# Patient Record
Sex: Female | Born: 1955
Health system: Southern US, Community
[De-identification: ages and names within clinical notes are randomized; demographics above are authoritative.]

## PROBLEM LIST (undated history)

## (undated) DIAGNOSIS — I10 Essential (primary) hypertension: Secondary | ICD-10-CM

## (undated) HISTORY — PX: TUBAL LIGATION: SHX77

---

## 2001-03-08 ENCOUNTER — Other Ambulatory Visit: Admission: RE | Admit: 2001-03-08 | Discharge: 2001-03-08 | Payer: Self-pay | Admitting: Family Medicine

## 2008-10-04 ENCOUNTER — Ambulatory Visit: Payer: Self-pay | Admitting: Nurse Practitioner

## 2008-10-04 DIAGNOSIS — F17200 Nicotine dependence, unspecified, uncomplicated: Secondary | ICD-10-CM

## 2008-10-04 DIAGNOSIS — Z720 Tobacco use: Secondary | ICD-10-CM | POA: Insufficient documentation

## 2008-10-04 DIAGNOSIS — I10 Essential (primary) hypertension: Secondary | ICD-10-CM

## 2008-10-04 DIAGNOSIS — K089 Disorder of teeth and supporting structures, unspecified: Secondary | ICD-10-CM | POA: Insufficient documentation

## 2008-11-05 ENCOUNTER — Encounter (INDEPENDENT_AMBULATORY_CARE_PROVIDER_SITE_OTHER): Payer: Self-pay | Admitting: Nurse Practitioner

## 2008-11-05 ENCOUNTER — Ambulatory Visit: Payer: Self-pay | Admitting: Nurse Practitioner

## 2008-11-05 DIAGNOSIS — A599 Trichomoniasis, unspecified: Secondary | ICD-10-CM

## 2008-11-05 DIAGNOSIS — K5900 Constipation, unspecified: Secondary | ICD-10-CM | POA: Insufficient documentation

## 2008-11-05 LAB — CONVERTED CEMR LAB
Chlamydia, DNA Probe: NEGATIVE
Eosinophils Absolute: 0.1 10*3/uL (ref 0.0–0.7)
Eosinophils Relative: 2 % (ref 0–5)
GC Probe Amp, Genital: NEGATIVE
Hemoglobin: 10.4 g/dL — ABNORMAL LOW (ref 12.0–15.0)
KOH Prep: NEGATIVE
Ketones, urine, test strip: NEGATIVE
Lymphocytes Relative: 32 % (ref 12–46)
MCHC: 28.9 g/dL — ABNORMAL LOW (ref 30.0–36.0)
MCV: 83.9 fL (ref 78.0–100.0)
Monocytes Absolute: 0.4 10*3/uL (ref 0.1–1.0)
Monocytes Relative: 6 % (ref 3–12)
Neutro Abs: 3.4 10*3/uL (ref 1.7–7.7)
Platelets: 328 10*3/uL (ref 150–400)
Potassium: 4.6 meq/L (ref 3.5–5.3)
Sodium: 140 meq/L (ref 135–145)
TSH: 0.786 microintl units/mL (ref 0.350–4.50)
Total Bilirubin: 0.3 mg/dL (ref 0.3–1.2)
Total CHOL/HDL Ratio: 4.1
Triglycerides: 79 mg/dL (ref <150)
pH: 7.5

## 2008-11-08 ENCOUNTER — Encounter (INDEPENDENT_AMBULATORY_CARE_PROVIDER_SITE_OTHER): Payer: Self-pay | Admitting: Nurse Practitioner

## 2008-11-20 ENCOUNTER — Ambulatory Visit (HOSPITAL_COMMUNITY): Admission: RE | Admit: 2008-11-20 | Discharge: 2008-11-20 | Payer: Self-pay | Admitting: Family Medicine

## 2008-11-26 ENCOUNTER — Ambulatory Visit: Payer: Self-pay | Admitting: Nurse Practitioner

## 2009-02-18 ENCOUNTER — Ambulatory Visit: Payer: Self-pay | Admitting: Nurse Practitioner

## 2009-03-18 ENCOUNTER — Ambulatory Visit: Payer: Self-pay | Admitting: Nurse Practitioner

## 2009-04-15 ENCOUNTER — Ambulatory Visit: Payer: Self-pay | Admitting: Nurse Practitioner

## 2009-05-15 ENCOUNTER — Ambulatory Visit: Payer: Self-pay | Admitting: Nurse Practitioner

## 2009-05-15 DIAGNOSIS — D239 Other benign neoplasm of skin, unspecified: Secondary | ICD-10-CM | POA: Insufficient documentation

## 2009-06-05 ENCOUNTER — Ambulatory Visit: Payer: Self-pay | Admitting: Nurse Practitioner

## 2009-07-01 ENCOUNTER — Ambulatory Visit: Payer: Self-pay | Admitting: Nurse Practitioner

## 2009-08-11 ENCOUNTER — Encounter (INDEPENDENT_AMBULATORY_CARE_PROVIDER_SITE_OTHER): Payer: Self-pay | Admitting: Family Medicine

## 2009-08-29 ENCOUNTER — Ambulatory Visit: Payer: Self-pay | Admitting: Nurse Practitioner

## 2009-08-29 DIAGNOSIS — M25569 Pain in unspecified knee: Secondary | ICD-10-CM

## 2013-10-09 ENCOUNTER — Emergency Department (INDEPENDENT_AMBULATORY_CARE_PROVIDER_SITE_OTHER)
Admission: EM | Admit: 2013-10-09 | Discharge: 2013-10-09 | Disposition: A | Discharge status: Home / Self Care | Payer: Self-pay | Source: Home / Self Care | Attending: Family Medicine | Admitting: Family Medicine

## 2013-10-09 ENCOUNTER — Encounter (HOSPITAL_COMMUNITY): Payer: Self-pay | Admitting: Emergency Medicine

## 2013-10-09 DIAGNOSIS — I10 Essential (primary) hypertension: Secondary | ICD-10-CM

## 2013-10-09 HISTORY — DX: Essential (primary) hypertension: I10

## 2013-10-09 LAB — POCT I-STAT, CHEM 8
BUN: 18 mg/dL (ref 6–23)
Calcium, Ion: 1.2 mmol/L (ref 1.12–1.23)
Creatinine, Ser: 1 mg/dL (ref 0.50–1.10)
Potassium: 3.7 mEq/L (ref 3.5–5.1)
Sodium: 143 mEq/L (ref 135–145)
TCO2: 28 mmol/L (ref 0–100)

## 2013-10-09 MED ORDER — CLONIDINE HCL 0.1 MG PO TABS
0.1000 mg | ORAL_TABLET | Freq: Once | ORAL | Status: AC
  Administered 2013-10-09: 0.1 mg via ORAL

## 2013-10-09 MED ORDER — FUROSEMIDE 40 MG PO TABS
ORAL_TABLET | ORAL | Status: AC
  Filled 2013-10-09: qty 1

## 2013-10-09 MED ORDER — CLONIDINE HCL 0.1 MG PO TABS
ORAL_TABLET | ORAL | Status: AC
  Filled 2013-10-09: qty 1

## 2013-10-09 MED ORDER — LISINOPRIL 10 MG PO TABS: 10.0000 mg | ORAL_TABLET | Freq: Every day | ORAL | Status: DC

## 2013-10-09 MED ORDER — AMLODIPINE BESYLATE 5 MG PO TABS: 5.0000 mg | ORAL_TABLET | Freq: Every day | ORAL | Status: DC

## 2013-10-09 MED ORDER — HYDROCHLOROTHIAZIDE 12.5 MG PO TABS: 12.5000 mg | ORAL_TABLET | Freq: Every day | ORAL | Status: DC

## 2013-10-09 MED ORDER — FUROSEMIDE 40 MG PO TABS
40.0000 mg | ORAL_TABLET | Freq: Once | ORAL | Status: AC
  Administered 2013-10-09: 40 mg via ORAL

## 2013-10-09 NOTE — ED Provider Notes (Signed)
CSN: [id number]     Arrival date & time 10/09/13  1348 History   First MD Initiated Contact with Patient 10/09/13 1447     Chief Complaint  Patient presents with  . Hypertension   (Consider location/radiation/quality/duration/timing/severity/associated sxs/prior Treatment) Patient is a 57 y.o. female presenting with hypertension. The history is provided by the patient.  Hypertension This is a chronic problem. Episode onset: out of bp med for approx 1 yr since [facility] closed and didn't know where to go. Pertinent negatives include no chest pain, no headaches and no shortness of breath.    Past Medical History  Diagnosis Date  . Hypertension    History reviewed. No pertinent past surgical history. No family history on file. History  Substance Use Topics  . Smoking status: Never Smoker   . Smokeless tobacco: Not on file  . Alcohol Use: No   OB History   Grav Para Term Preterm Abortions TAB SAB Ect Mult Living                 Review of Systems  Constitutional: Negative.   Respiratory: Negative for chest tightness, shortness of breath and wheezing.   Cardiovascular: Positive for leg swelling. Negative for chest pain.  Neurological: Negative for dizziness, speech difficulty, weakness and headaches.    Allergies  Review of patient's allergies indicates no known allergies.  Home Medications   Current Outpatient Rx  Name  Route  Sig  Dispense  Refill  . amLODipine (NORVASC) 5 MG tablet   Oral   Take 1 tablet (5 mg total) by mouth daily.   30 tablet   0   . hydrochlorothiazide (HYDRODIURIL) 12.5 MG tablet   Oral   Take 1 tablet (12.5 mg total) by mouth daily.   30 tablet   1   . lisinopril (PRINIVIL,ZESTRIL) 10 MG tablet   Oral   Take 1 tablet (10 mg total) by mouth daily.   30 tablet   0    BP 199/106  Pulse 65  Temp(Src) 99.1 F (37.3 C) (Oral)  Resp 15  SpO2 98%  LMP 08/09/2013 Physical Exam  Nursing note and vitals reviewed. Constitutional: She  is oriented to person, place, and time. She appears well-developed and well-nourished.  Neck: Normal range of motion. Neck supple.  Cardiovascular: Normal rate, regular rhythm, normal heart sounds and intact distal pulses.   Pulmonary/Chest: Effort normal and breath sounds normal.  Musculoskeletal: She exhibits no edema.  Lymphadenopathy:    She has no cervical adenopathy.  Neurological: She is alert and oriented to person, place, and time.  Skin: Skin is warm and dry.    ED Course  Procedures (including critical care time) Labs Review Labs Reviewed - No data to display Imaging Review No results found.  EKG Interpretation     Ventricular Rate:    PR Interval:    QRS Duration:   QT Interval:    QTC Calculation:   R Axis:     Text Interpretation:              MDM  i-stat wnl.    Linna Hoff, MD 10/09/13 [phone]

## 2013-10-09 NOTE — ED Notes (Signed)
Reports htn history.  Patient participated in bp check at [facility] today, bp noted to be high.  Patient reports was patient of [facility] until [facility], ran out of bp med and now needing to secure a pcp.

## 2015-08-14 ENCOUNTER — Emergency Department (HOSPITAL_COMMUNITY)
Admission: EM | Admit: 2015-08-14 | Discharge: 2015-08-15 | Disposition: A | Discharge status: Home / Self Care | Payer: No Typology Code available for payment source | Attending: Emergency Medicine | Admitting: Emergency Medicine

## 2015-08-14 ENCOUNTER — Encounter (HOSPITAL_COMMUNITY): Payer: Self-pay | Admitting: *Deleted

## 2015-08-14 DIAGNOSIS — S199XXA Unspecified injury of neck, initial encounter: Secondary | ICD-10-CM | POA: Diagnosis present

## 2015-08-14 DIAGNOSIS — Y9389 Activity, other specified: Secondary | ICD-10-CM | POA: Diagnosis not present

## 2015-08-14 DIAGNOSIS — Y92410 Unspecified street and highway as the place of occurrence of the external cause: Secondary | ICD-10-CM | POA: Diagnosis not present

## 2015-08-14 DIAGNOSIS — Z79899 Other long term (current) drug therapy: Secondary | ICD-10-CM | POA: Insufficient documentation

## 2015-08-14 DIAGNOSIS — I10 Essential (primary) hypertension: Secondary | ICD-10-CM

## 2015-08-14 DIAGNOSIS — Y999 Unspecified external cause status: Secondary | ICD-10-CM | POA: Diagnosis not present

## 2015-08-14 DIAGNOSIS — S134XXA Sprain of ligaments of cervical spine, initial encounter: Secondary | ICD-10-CM | POA: Diagnosis not present

## 2015-08-14 NOTE — ED Notes (Signed)
Pt has not taken her blood pressure medication today.
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
## 2015-08-15 ENCOUNTER — Emergency Department (HOSPITAL_COMMUNITY): Payer: No Typology Code available for payment source

## 2015-08-15 DIAGNOSIS — S134XXA Sprain of ligaments of cervical spine, initial encounter: Secondary | ICD-10-CM | POA: Diagnosis not present

## 2015-08-15 MED ORDER — HYDROCHLOROTHIAZIDE 25 MG PO TABS
12.5000 mg | ORAL_TABLET | Freq: Every day | ORAL | Status: DC
  Administered 2015-08-15: 12.5 mg via ORAL
  Filled 2015-08-15: qty 1

## 2015-08-15 MED ORDER — CYCLOBENZAPRINE HCL 10 MG PO TABS: 5.0000 mg | ORAL_TABLET | Freq: Two times a day (BID) | ORAL | Status: DC | PRN

## 2015-08-15 MED ORDER — MELOXICAM 15 MG PO TABS: 15.0000 mg | ORAL_TABLET | Freq: Every day | ORAL | Status: DC

## 2015-08-15 MED ORDER — IBUPROFEN 400 MG PO TABS
800.0000 mg | ORAL_TABLET | Freq: Once | ORAL | Status: AC
  Administered 2015-08-15: 800 mg via ORAL
  Filled 2015-08-15: qty 2

## 2015-08-15 MED ORDER — CYCLOBENZAPRINE HCL 10 MG PO TABS
5.0000 mg | ORAL_TABLET | Freq: Once | ORAL | Status: AC
  Administered 2015-08-15: 5 mg via ORAL
  Filled 2015-08-15: qty 1

## 2015-08-15 MED ORDER — LISINOPRIL 10 MG PO TABS
10.0000 mg | ORAL_TABLET | Freq: Every day | ORAL | Status: DC
  Administered 2015-08-15: 10 mg via ORAL
  Filled 2015-08-15: qty 1

## 2015-08-15 MED ORDER — AMLODIPINE BESYLATE 5 MG PO TABS
5.0000 mg | ORAL_TABLET | Freq: Every day | ORAL | Status: DC
  Administered 2015-08-15: 5 mg via ORAL
  Filled 2015-08-15: qty 1

## 2015-08-15 NOTE — Discharge Instructions (Signed)
Cervical Sprain °A cervical sprain is an injury in the neck in which the strong, fibrous tissues (ligaments) that connect your neck bones stretch or tear. Cervical sprains can range from mild to severe. Severe cervical sprains can cause the neck vertebrae to be unstable. This can lead to damage of the spinal cord and can result in serious nervous system problems. The amount of time it takes for a cervical sprain to get better depends on the cause and extent of the injury. Most cervical sprains heal in 1 to 3 weeks. °CAUSES  °Severe cervical sprains may be caused by:  °· Contact sport injuries (such as from football, rugby, wrestling, hockey, auto racing, gymnastics, diving, martial arts, or boxing).   °· Motor vehicle collisions.   °· Whiplash injuries. This is an injury from a sudden forward and backward whipping movement of the head and neck.  °· Falls.   °Mild cervical sprains may be caused by:  °· Being in an awkward position, such as while cradling a telephone between your ear and shoulder.   °· Sitting in a chair that does not offer proper support.   °· Working at a poorly designed computer station.   °· Looking up or down for long periods of time.   °SYMPTOMS  °· Pain, soreness, stiffness, or a burning sensation in the front, back, or sides of the neck. This discomfort may develop immediately after the injury or slowly, 24 hours or more after the injury.   °· Pain or tenderness directly in the middle of the back of the neck.   °· Shoulder or upper back pain.   °· Limited ability to move the neck.   °· Headache.   °· Dizziness.   °· Weakness, numbness, or tingling in the hands or arms.   °· Muscle spasms.   °· Difficulty swallowing or chewing.   °· Tenderness and swelling of the neck.   °DIAGNOSIS  °Most of the time your health care provider can diagnose a cervical sprain by taking your history and doing a physical exam. Your health care provider will ask about previous neck injuries and any known neck  problems, such as arthritis in the neck. X-rays may be taken to find out if there are any other problems, such as with the bones of the neck. Other tests, such as a CT scan or MRI, may also be needed.  °TREATMENT  °Treatment depends on the severity of the cervical sprain. Mild sprains can be treated with rest, keeping the neck in place (immobilization), and pain medicines. Severe cervical sprains are immediately immobilized. Further treatment is done to help with pain, muscle spasms, and other symptoms and may include: °· Medicines, such as pain relievers, numbing medicines, or muscle relaxants.   °· Physical therapy. This may involve stretching exercises, strengthening exercises, and posture training. Exercises and improved posture can help stabilize the neck, strengthen muscles, and help stop symptoms from returning.   °HOME CARE INSTRUCTIONS  °· Put ice on the injured area.   °¨ Put ice in a plastic bag.   °¨ Place a towel between your skin and the bag.   °¨ Leave the ice on for 15-20 minutes, 3-4 times a day.   °· If your injury was severe, you may have been given a cervical collar to wear. A cervical collar is a two-piece collar designed to keep your neck from moving while it heals. °¨ Do not remove the collar unless instructed by your health care provider. °¨ If you have long hair, keep it outside of the collar. °¨ Ask your health care provider before making any adjustments to your collar. Minor   adjustments may be required over time to improve comfort and reduce pressure on your chin or on the back of your head. °¨ If you are allowed to remove the collar for cleaning or bathing, follow your health care provider's instructions on how to do so safely. °¨ Keep your collar clean by wiping it with mild soap and water and drying it completely. If the collar you have been given includes removable pads, remove them every 1-2 days and hand wash them with soap and water. Allow them to air dry. They should be completely  dry before you wear them in the collar. °¨ If you are allowed to remove the collar for cleaning and bathing, wash and dry the skin of your neck. Check your skin for irritation or sores. If you see any, tell your health care provider. °¨ Do not drive while wearing the collar.   °· Only take over-the-counter or prescription medicines for pain, discomfort, or fever as directed by your health care provider.   °· Keep all follow-up appointments as directed by your health care provider.   °· Keep all physical therapy appointments as directed by your health care provider.   °· Make any needed adjustments to your workstation to promote good posture.   °· Avoid positions and activities that make your symptoms worse.   °· Warm up and stretch before being active to help prevent problems.   °SEEK MEDICAL CARE IF:  °· Your pain is not controlled with medicine.   °· You are unable to decrease your pain medicine over time as planned.   °· Your activity level is not improving as expected.   °SEEK IMMEDIATE MEDICAL CARE IF:  °· You develop any bleeding. °· You develop stomach upset. °· You have signs of an allergic reaction to your medicine.   °· Your symptoms get worse.   °· You develop new, unexplained symptoms.   °· You have numbness, tingling, weakness, or paralysis in any part of your body.   °MAKE SURE YOU:  °· Understand these instructions. °· Will watch your condition. °· Will get help right away if you are not doing well or get worse. °Document Released: 10/10/2007 Document Revised: 12/18/2013 Document Reviewed: 06/20/2013 °[employer]® Patient Information ©2015 [employer]. This information is not intended to replace advice given to you by your health care provider. Make sure you discuss any questions you have with your health care provider. ° °Motor Vehicle Collision °It is common to have multiple bruises and sore muscles after a motor vehicle collision (MVC). These tend to feel worse for the first 24 hours. You may have  the most stiffness and soreness over the first several hours. You may also feel worse when you wake up the first morning after your collision. After this point, you will usually begin to improve with each day. The speed of improvement often depends on the severity of the collision, the number of injuries, and the location and nature of these injuries. °HOME CARE INSTRUCTIONS °· Put ice on the injured area. °¨ Put ice in a plastic bag. °¨ Place a towel between your skin and the bag. °¨ Leave the ice on for 15-20 minutes, 3-4 times a day, or as directed by your health care provider. °· Drink enough fluids to keep your urine clear or pale yellow. Do not drink alcohol. °· Take a warm shower or bath once or twice a day. This will increase blood flow to sore muscles. °· You may return to activities as directed by your caregiver. Be careful when lifting, as this may aggravate neck or back   pain.  Only take over-the-counter or prescription medicines for pain, discomfort, or fever as directed by your caregiver. Do not use aspirin. This may increase bruising and bleeding. SEEK IMMEDIATE MEDICAL CARE IF:  You have numbness, tingling, or weakness in the arms or legs.  You develop severe headaches not relieved with medicine.  You have severe neck pain, especially tenderness in the middle of the back of your neck.  You have changes in bowel or bladder control.  There is increasing pain in any area of the body.  You have shortness of breath, light-headedness, dizziness, or fainting.  You have chest pain.  You feel sick to your stomach (nauseous), throw up (vomit), or sweat.  You have increasing abdominal discomfort.  There is blood in your urine, stool, or vomit.  You have pain in your shoulder (shoulder strap areas).  You feel your symptoms are getting worse. MAKE SURE YOU:  Understand these instructions.  Will watch your condition.  Will get help right away if you are not doing well or get  worse. Document Released: 12/13/2005 Document Revised: 04/29/2014 Document Reviewed: 05/12/2011 [facility] Patient Information 2015 [place], [place]. This information is not intended to replace advice given to you by your health care provider. Make sure you discuss any questions you have with your health care provider.  Motor Vehicle Collision After a car crash (motor vehicle collision), it is normal to have bruises and sore muscles. The first 24 hours usually feel the worst. After that, you will likely start to feel better each day. HOME CARE  Put ice on the injured area.  Put ice in a plastic bag.  Place a towel between your skin and the bag.  Leave the ice on for 15-20 minutes, 03-04 times a day.  Drink enough fluids to keep your pee (urine) clear or pale yellow.  Do not drink alcohol.  Take a warm shower or bath 1 or 2 times a day. This helps your sore muscles.  Return to activities as told by your doctor. Be careful when lifting. Lifting can make neck or back pain worse.  Only take medicine as told by your doctor. Do not use aspirin. GET HELP RIGHT AWAY IF:   Your arms or legs tingle, feel weak, or lose feeling (numbness).  You have headaches that do not get better with medicine.  You have neck pain, especially in the middle of the back of your neck.  You cannot control when you pee (urinate) or poop (bowel movement).  Pain is getting worse in any part of your body.  You are short of breath, dizzy, or pass out (faint).  You have chest pain.  You feel sick to your stomach (nauseous), throw up (vomit), or sweat.  You have belly (abdominal) pain that gets worse.  There is blood in your pee, poop, or throw up.  You have pain in your shoulder (shoulder strap areas).  Your problems are getting worse. MAKE SURE YOU:   Understand these instructions.  Will watch your condition.  Will get help right away if you are not doing well or get worse. Document Released:  05/31/2008 Document Revised: 03/06/2012 Document Reviewed: 05/12/2011 [facility] Patient Information 2015 [place], [place]. This information is not intended to replace advice given to you by your health care provider. Make sure you discuss any questions you have with your health care provider.  Cervical Sprain A cervical sprain is when the tissues (ligaments) that hold the neck bones in place stretch or tear. HOME CARE  Put ice on the injured area.  Put ice in a plastic bag.  Place a towel between your skin and the bag.  Leave the ice on for 15-20 minutes, 3-4 times a day.  You may have been given a collar to wear. This collar keeps your neck from moving while you heal.  Do not take the collar off unless told by your doctor.  If you have long hair, keep it outside of the collar.  Ask your doctor before changing the position of your collar. You may need to change its position over time to make it more comfortable.  If you are allowed to take off the collar for cleaning or bathing, follow your doctor's instructions on how to do it safely.  Keep your collar clean by wiping it with mild soap and water. Dry it completely. If the collar has removable pads, remove them every 1-2 days to hand wash them with soap and water. Allow them to air dry. They should be dry before you wear them in the collar.  Do not drive while wearing the collar.  Only take medicine as told by your doctor.  Keep all doctor visits as told.  Keep all physical therapy visits as told.  Adjust your work station so that you have good posture while you work.  Avoid positions and activities that make your problems worse.  Warm up and stretch before being active. GET HELP IF:  Your pain is not controlled with medicine.  You cannot take less pain medicine over time as planned.  Your activity level does not improve as expected. GET HELP RIGHT AWAY IF:   You are bleeding.  Your stomach is upset.  You have an  allergic reaction to your medicine.  You develop new problems that you cannot explain.  You lose feeling (become numb) or you cannot move any part of your body (paralysis).  You have tingling or weakness in any part of your body.  Your symptoms get worse. Symptoms include:  Pain, soreness, stiffness, puffiness (swelling), or a burning feeling in your neck.  Pain when your neck is touched.  Shoulder or upper back pain.  Limited ability to move your neck.  Headache.  Dizziness.  Your hands or arms feel week, lose feeling, or tingle.  Muscle spasms.  Difficulty swallowing or chewing. MAKE SURE YOU:   Understand these instructions.  Will watch your condition.  Will get help right away if you are not doing well or get worse. Document Released: 05/31/2008 Document Revised: 08/15/2013 Document Reviewed: 06/20/2013 [facility] Patient Information 2015 [place], [place]. This information is not intended to replace advice given to you by your health care provider. Make sure you discuss any questions you have with your health care provider.
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
## 2020-08-20 ENCOUNTER — Encounter (HOSPITAL_COMMUNITY): Payer: Self-pay | Admitting: Emergency Medicine

## 2020-08-20 ENCOUNTER — Emergency Department (HOSPITAL_COMMUNITY): Payer: Medicare HMO

## 2020-08-20 ENCOUNTER — Emergency Department (HOSPITAL_COMMUNITY)
Admission: EM | Admit: 2020-08-20 | Discharge: 2020-08-20 | Disposition: A | Discharge status: Home / Self Care | Payer: Medicare HMO | Attending: Emergency Medicine | Admitting: Emergency Medicine

## 2020-08-20 DIAGNOSIS — I10 Essential (primary) hypertension: Secondary | ICD-10-CM

## 2020-08-20 DIAGNOSIS — M79603 Pain in arm, unspecified: Secondary | ICD-10-CM | POA: Diagnosis not present

## 2020-08-20 DIAGNOSIS — M545 Low back pain: Secondary | ICD-10-CM | POA: Diagnosis not present

## 2020-08-20 DIAGNOSIS — Z79899 Other long term (current) drug therapy: Secondary | ICD-10-CM | POA: Insufficient documentation

## 2020-08-20 DIAGNOSIS — R52 Pain, unspecified: Secondary | ICD-10-CM | POA: Diagnosis not present

## 2020-08-20 MED ORDER — CYCLOBENZAPRINE HCL 10 MG PO TABS: 10.0000 mg | ORAL_TABLET | Freq: Three times a day (TID) | ORAL | 0 refills | Status: AC

## 2020-08-20 MED ORDER — AMLODIPINE BESYLATE 5 MG PO TABS
5.0000 mg | ORAL_TABLET | Freq: Once | ORAL | Status: AC
  Administered 2020-08-20: 5 mg via ORAL
  Filled 2020-08-20: qty 1

## 2020-08-20 MED ORDER — LISINOPRIL 10 MG PO TABS: 10.0000 mg | ORAL_TABLET | Freq: Every day | ORAL | 0 refills | Status: DC

## 2020-08-20 MED ORDER — CYCLOBENZAPRINE HCL 10 MG PO TABS
5.0000 mg | ORAL_TABLET | Freq: Once | ORAL | Status: AC
  Administered 2020-08-20: 5 mg via ORAL
  Filled 2020-08-20: qty 1

## 2020-08-20 MED ORDER — HYDROCHLOROTHIAZIDE 12.5 MG PO CAPS
12.5000 mg | ORAL_CAPSULE | Freq: Every day | ORAL | Status: DC
  Administered 2020-08-20: 12.5 mg via ORAL
  Filled 2020-08-20: qty 1

## 2020-08-20 MED ORDER — LISINOPRIL 10 MG PO TABS
10.0000 mg | ORAL_TABLET | Freq: Once | ORAL | Status: AC
  Administered 2020-08-20: 10 mg via ORAL
  Filled 2020-08-20: qty 1

## 2020-08-20 MED ORDER — LIDOCAINE 5 % EX PTCH
1.0000 | MEDICATED_PATCH | CUTANEOUS | Status: DC
  Administered 2020-08-20: 1 via TRANSDERMAL
  Filled 2020-08-20: qty 1

## 2020-08-20 MED ORDER — HYDROCHLOROTHIAZIDE 12.5 MG PO TABS: 12.5000 mg | ORAL_TABLET | Freq: Every day | ORAL | 1 refills | Status: DC

## 2020-08-20 MED ORDER — ACETAMINOPHEN 500 MG PO TABS
1000.0000 mg | ORAL_TABLET | Freq: Once | ORAL | Status: AC
  Administered 2020-08-20: 1000 mg via ORAL
  Filled 2020-08-20: qty 2

## 2020-08-20 MED ORDER — AMLODIPINE BESYLATE 5 MG PO TABS: 5.0000 mg | ORAL_TABLET | Freq: Every day | ORAL | 0 refills | Status: DC
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
## 2020-08-20 NOTE — Discharge Instructions (Addendum)
Take blood pressure medicines  Schedule appointment with primary care doctor  Tylenol 1000 mg every 6 hours for back pain, flexeril for spasms  Return for worsening pain
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
## 2020-09-05 ENCOUNTER — Ambulatory Visit: Payer: Medicare HMO | Admitting: Family Medicine

## 2020-09-17 ENCOUNTER — Encounter: Payer: Self-pay | Admitting: Family Medicine

## 2020-09-17 ENCOUNTER — Ambulatory Visit (INDEPENDENT_AMBULATORY_CARE_PROVIDER_SITE_OTHER): Payer: Medicare Other | Admitting: Family Medicine

## 2020-09-17 ENCOUNTER — Other Ambulatory Visit: Payer: Self-pay

## 2020-09-17 VITALS — BP 150/100 | HR 92 | Ht 62.0 in | Wt 203.0 lb

## 2020-09-17 DIAGNOSIS — K219 Gastro-esophageal reflux disease without esophagitis: Secondary | ICD-10-CM | POA: Diagnosis not present

## 2020-09-17 DIAGNOSIS — M25511 Pain in right shoulder: Secondary | ICD-10-CM | POA: Diagnosis not present

## 2020-09-17 DIAGNOSIS — R635 Abnormal weight gain: Secondary | ICD-10-CM

## 2020-09-17 DIAGNOSIS — Z23 Encounter for immunization: Secondary | ICD-10-CM

## 2020-09-17 DIAGNOSIS — E669 Obesity, unspecified: Secondary | ICD-10-CM | POA: Diagnosis not present

## 2020-09-17 DIAGNOSIS — I10 Essential (primary) hypertension: Secondary | ICD-10-CM | POA: Diagnosis not present

## 2020-09-17 LAB — POCT URINALYSIS DIP (PROADVANTAGE DEVICE)
Blood, UA: NEGATIVE
Glucose, UA: NEGATIVE mg/dL
Ketones, POC UA: NEGATIVE mg/dL
Nitrite, UA: NEGATIVE
Protein Ur, POC: 30 mg/dL — AB
Specific Gravity, Urine: 1.015
pH, UA: 7 (ref 5.0–8.0)

## 2020-09-17 MED ORDER — LISINOPRIL 20 MG PO TABS: 20.0000 mg | ORAL_TABLET | Freq: Every day | ORAL | 2 refills | Status: DC

## 2020-09-17 MED ORDER — HYDROCHLOROTHIAZIDE 25 MG PO TABS: 25.0000 mg | ORAL_TABLET | Freq: Every day | ORAL | 2 refills | Status: DC
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
## 2020-09-17 NOTE — Patient Instructions (Addendum)
Try to get a blood pressure machine (not a wrist one) and check your blood pressure daily over the next 4 weeks.  Cut back on salt and foods high in sodium.   I am increasing your lisinopril to 20 mg daily and hydrochlorothiazide to 25 mg daily.  Continue on amlodipine 5 mg.   Follow up with me in 4 weeks for your blood pressure.  We will also schedule you for a medicare wellness visit.   I will be in touch with your lab results.      DASH Eating Plan DASH stands for "Dietary Approaches to Stop Hypertension." The DASH eating plan is a healthy eating plan that has been shown to reduce high blood pressure (hypertension). It may also reduce your risk for type 2 diabetes, heart disease, and stroke. The DASH eating plan may also help with weight loss. What are tips for following this plan?  General guidelines  Avoid eating more than 2,300 mg (milligrams) of salt (sodium) a day. If you have hypertension, you may need to reduce your sodium intake to 1,500 mg a day.  Limit alcohol intake to no more than 1 drink a day for nonpregnant women and 2 drinks a day for men. One drink equals 12 oz of beer, 5 oz of wine, or 1 oz of hard liquor.  Work with your health care provider to maintain a healthy body weight or to lose weight. Ask what an ideal weight is for you.  Get at least 30 minutes of exercise that causes your heart to beat faster (aerobic exercise) most days of the week. Activities may include walking, swimming, or biking.  Work with your health care provider or diet and nutrition specialist (dietitian) to adjust your eating plan to your individual calorie needs. Reading food labels   Check food labels for the amount of sodium per serving. Choose foods with less than 5 percent of the Daily Value of sodium. Generally, foods with less than 300 mg of sodium per serving fit into this eating plan.  To find whole grains, look for the word "whole" as the first word in the ingredient  list. Shopping  Buy products labeled as "low-sodium" or "no salt added."  Buy fresh foods. Avoid canned foods and premade or frozen meals. Cooking  Avoid adding salt when cooking. Use salt-free seasonings or herbs instead of table salt or sea salt. Check with your health care provider or pharmacist before using salt substitutes.  Do not fry foods. Cook foods using healthy methods such as baking, boiling, grilling, and broiling instead.  Cook with heart-healthy oils, such as olive, canola, soybean, or sunflower oil. Meal planning  Eat a balanced diet that includes: ? 5 or more servings of fruits and vegetables each day. At each meal, try to fill half of your plate with fruits and vegetables. ? Up to 6-8 servings of whole grains each day. ? Less than 6 oz of lean meat, poultry, or fish each day. A 3-oz serving of meat is about the same size as a deck of cards. One egg equals 1 oz. ? 2 servings of low-fat dairy each day. ? A serving of nuts, seeds, or beans 5 times each week. ? Heart-healthy fats. Healthy fats called Omega-3 fatty acids are found in foods such as flaxseeds and coldwater fish, like sardines, salmon, and mackerel.  Limit how much you eat of the following: ? Canned or prepackaged foods. ? Food that is high in trans fat, such as fried foods. ?  Food that is high in saturated fat, such as fatty meat. ? Sweets, desserts, sugary drinks, and other foods with added sugar. ? Full-fat dairy products.  Do not salt foods before eating.  Try to eat at least 2 vegetarian meals each week.  Eat more home-cooked food and less restaurant, buffet, and fast food.  When eating at a restaurant, ask that your food be prepared with less salt or no salt, if possible. What foods are recommended? The items listed may not be a complete list. Talk with your dietitian about what dietary choices are best for you. Grains Whole-grain or whole-wheat bread. Whole-grain or whole-wheat pasta. Brown  rice. [place]. Bulgur. Whole-grain and low-sodium cereals. Pita bread. Low-fat, low-sodium crackers. Whole-wheat flour tortillas. Vegetables Fresh or frozen vegetables (raw, steamed, roasted, or grilled). Low-sodium or reduced-sodium tomato and vegetable juice. Low-sodium or reduced-sodium tomato sauce and tomato paste. Low-sodium or reduced-sodium canned vegetables. Fruits All fresh, dried, or frozen fruit. Canned fruit in natural juice (without added sugar). Meat and other protein foods Skinless chicken or [place]. Ground chicken or [place]. Pork with fat trimmed off. Fish and seafood. Egg whites. Dried beans, peas, or lentils. Unsalted nuts, nut butters, and seeds. Unsalted canned beans. Lean cuts of beef with fat trimmed off. Low-sodium, lean deli meat. Dairy Low-fat (1%) or fat-free (skim) milk. Fat-free, low-fat, or reduced-fat cheeses. Nonfat, low-sodium ricotta or cottage cheese. Low-fat or nonfat yogurt. Low-fat, low-sodium cheese. Fats and oils Soft margarine without trans fats. Vegetable oil. Low-fat, reduced-fat, or light mayonnaise and salad dressings (reduced-sodium). Canola, safflower, olive, soybean, and sunflower oils. Avocado. Seasoning and other foods Herbs. Spices. Seasoning mixes without salt. Unsalted popcorn and pretzels. Fat-free sweets. What foods are not recommended? The items listed may not be a complete list. Talk with your dietitian about what dietary choices are best for you. Grains Baked goods made with fat, such as croissants, muffins, or some breads. Dry pasta or rice meal packs. Vegetables Creamed or fried vegetables. Vegetables in a cheese sauce. Regular canned vegetables (not low-sodium or reduced-sodium). Regular canned tomato sauce and paste (not low-sodium or reduced-sodium). Regular tomato and vegetable juice (not low-sodium or reduced-sodium). Angie Fava. Olives. Fruits Canned fruit in a light or heavy syrup. Fried fruit. Fruit in cream or butter  sauce. Meat and other protein foods Fatty cuts of meat. Ribs. Fried meat. Berniece Salines. Sausage. Bologna and other processed lunch meats. Salami. Fatback. Hotdogs. Bratwurst. Salted nuts and seeds. Canned beans with added salt. Canned or smoked fish. Whole eggs or egg yolks. Chicken or [place] with skin. Dairy Whole or 2% milk, cream, and half-and-half. Whole or full-fat cream cheese. Whole-fat or sweetened yogurt. Full-fat cheese. Nondairy creamers. Whipped toppings. Processed cheese and cheese spreads. Fats and oils Butter. Stick margarine. Lard. Shortening. Ghee. Bacon fat. Tropical oils, such as coconut, palm kernel, or palm oil. Seasoning and other foods Salted popcorn and pretzels. Onion salt, garlic salt, seasoned salt, table salt, and sea salt. Worcestershire sauce. Tartar sauce. Barbecue sauce. Teriyaki sauce. Soy sauce, including reduced-sodium. Steak sauce. Canned and packaged gravies. Fish sauce. Oyster sauce. Cocktail sauce. Horseradish that you find on the shelf. Ketchup. Mustard. Meat flavorings and tenderizers. Bouillon cubes. Hot sauce and Tabasco sauce. Premade or packaged marinades. Premade or packaged taco seasonings. Relishes. Regular salad dressings. Where to find more information:  National Heart, Lung, and [place]: https://wilson-eaton.com/  American Heart Association: www.heart.org Summary  The DASH eating plan is a healthy eating plan that has been shown to reduce high blood  pressure (hypertension). It may also reduce your risk for type 2 diabetes, heart disease, and stroke.  With the DASH eating plan, you should limit salt (sodium) intake to 2,300 mg a day. If you have hypertension, you may need to reduce your sodium intake to 1,500 mg a day.  When on the DASH eating plan, aim to eat more fresh fruits and vegetables, whole grains, lean proteins, low-fat dairy, and heart-healthy fats.  Work with your health care provider or diet and nutrition specialist (dietitian) to adjust  your eating plan to your individual calorie needs. This information is not intended to replace advice given to you by your health care provider. Make sure you discuss any questions you have with your health care provider. Document Revised: 11/25/2017 Document Reviewed: 12/06/2016 Elsevier Patient Education  2020 [employer].

## 2020-09-18 LAB — COMPREHENSIVE METABOLIC PANEL
ALT: 10 IU/L (ref 0–32)
AST: 14 IU/L (ref 0–40)
Albumin/Globulin Ratio: 1.2 (ref 1.2–2.2)
Albumin: 4.2 g/dL (ref 3.8–4.8)
Alkaline Phosphatase: 117 IU/L (ref 44–121)
BUN/Creatinine Ratio: 12 (ref 12–28)
BUN: 14 mg/dL (ref 8–27)
Bilirubin Total: 0.2 mg/dL (ref 0.0–1.2)
CO2: 25 mmol/L (ref 20–29)
Calcium: 9.2 mg/dL (ref 8.7–10.3)
Chloride: 104 mmol/L (ref 96–106)
Creatinine, Ser: 1.14 mg/dL — ABNORMAL HIGH (ref 0.57–1.00)
GFR calc Af Amer: 58 mL/min/{1.73_m2} — ABNORMAL LOW (ref >59)
GFR calc non Af Amer: 51 mL/min/{1.73_m2} — ABNORMAL LOW (ref >59)
Globulin, Total: 3.6 g/dL (ref 1.5–4.5)
Glucose: 99 mg/dL (ref 65–99)
Potassium: 4.4 mmol/L (ref 3.5–5.2)
Sodium: 142 mmol/L (ref 134–144)
Total Protein: 7.8 g/dL (ref 6.0–8.5)

## 2020-09-18 LAB — LIPID PANEL
Chol/HDL Ratio: 4.8 ratio — ABNORMAL HIGH (ref 0.0–4.4)
Cholesterol, Total: 228 mg/dL — ABNORMAL HIGH (ref 100–199)
HDL: 48 mg/dL (ref >39)
LDL Chol Calc (NIH): 155 mg/dL — ABNORMAL HIGH (ref 0–99)
Triglycerides: 139 mg/dL (ref 0–149)
VLDL Cholesterol Cal: 25 mg/dL (ref 5–40)

## 2020-09-18 LAB — CBC WITH DIFFERENTIAL/PLATELET
Basophils Absolute: 0 10*3/uL (ref 0.0–0.2)
Basos: 0 %
EOS (ABSOLUTE): 0.1 10*3/uL (ref 0.0–0.4)
Eos: 2 %
Hematocrit: 43 % (ref 34.0–46.6)
Hemoglobin: 14 g/dL (ref 11.1–15.9)
Immature Grans (Abs): 0 10*3/uL (ref 0.0–0.1)
Immature Granulocytes: 0 %
Lymphocytes Absolute: 2.3 10*3/uL (ref 0.7–3.1)
Lymphs: 35 %
MCH: 28.7 pg (ref 26.6–33.0)
MCHC: 32.6 g/dL (ref 31.5–35.7)
MCV: 88 fL (ref 79–97)
Monocytes Absolute: 0.4 10*3/uL (ref 0.1–0.9)
Monocytes: 6 %
Neutrophils Absolute: 3.6 10*3/uL (ref 1.4–7.0)
Neutrophils: 57 %
Platelets: 296 10*3/uL (ref 150–450)
RBC: 4.87 x10E6/uL (ref 3.77–5.28)
RDW: 13.4 % (ref 11.7–15.4)
WBC: 6.4 10*3/uL (ref 3.4–10.8)

## 2020-09-18 LAB — TSH: TSH: 1.36 u[IU]/mL (ref 0.450–4.500)

## 2020-09-18 LAB — T4, FREE: Free T4: 0.97 ng/dL (ref 0.82–1.77)

## 2020-09-18 NOTE — Progress Notes (Signed)
Her kidney function is mildly elevated so I recommend that we work on getting her blood pressure down to help protect her kidneys from any more damage. We will discuss her cholesterol at her follow up visit in 4 weeks. Please ask her to bring in her BP machine and readings (did she get a cuff or plan to?)

## 2020-09-22 ENCOUNTER — Encounter: Payer: Self-pay | Admitting: Internal Medicine

## 2020-10-08 ENCOUNTER — Telehealth: Payer: Self-pay | Admitting: Family Medicine

## 2020-10-08 ENCOUNTER — Other Ambulatory Visit: Payer: Self-pay

## 2020-10-08 ENCOUNTER — Ambulatory Visit (INDEPENDENT_AMBULATORY_CARE_PROVIDER_SITE_OTHER): Payer: Medicare HMO

## 2020-10-08 DIAGNOSIS — Z23 Encounter for immunization: Secondary | ICD-10-CM | POA: Diagnosis not present

## 2020-10-08 MED ORDER — AMLODIPINE BESYLATE 5 MG PO TABS
5.0000 mg | ORAL_TABLET | Freq: Every day | ORAL | 2 refills | Status: DC
Start: 2020-10-08 — End: 2021-01-07

## 2020-10-08 NOTE — Telephone Encounter (Signed)
refilled 

## 2020-10-08 NOTE — Telephone Encounter (Signed)
Ok to refill 

## 2020-10-08 NOTE — Telephone Encounter (Signed)
Pt needs refill on Amlodipine sent to the [facility] on [address]

## 2020-10-15 ENCOUNTER — Ambulatory Visit: Payer: Medicare HMO | Admitting: Family Medicine

## 2020-10-15 ENCOUNTER — Telehealth: Payer: Self-pay

## 2020-10-15 NOTE — Telephone Encounter (Signed)
Received fax from [facility] for a refill on the pts. Lisinopril, hctz, and amlodipine pt has apt today.

## 2020-10-15 NOTE — Telephone Encounter (Signed)
She did not show up for her appointment today. Please check with her and see if she is out of medication and if she plans to reschedule.

## 2020-10-16 ENCOUNTER — Encounter: Payer: Self-pay | Admitting: Family Medicine

## 2020-10-16 NOTE — Telephone Encounter (Signed)
Called pt. LM to call back about med refills.

## 2020-10-20 ENCOUNTER — Telehealth: Payer: Self-pay

## 2020-10-20 NOTE — Telephone Encounter (Signed)
Per pt, she can wait to get refilled at appt next week. She has enough

## 2020-10-20 NOTE — Telephone Encounter (Signed)
Recv'd fax from [facility] pt needs refills Lisinopril, HCTZ, Amlodipine all 90 days to [facility]

## 2020-10-29 ENCOUNTER — Ambulatory Visit (INDEPENDENT_AMBULATORY_CARE_PROVIDER_SITE_OTHER): Payer: Medicare HMO | Admitting: Family Medicine

## 2020-10-29 ENCOUNTER — Encounter: Payer: Self-pay | Admitting: Family Medicine

## 2020-10-29 ENCOUNTER — Other Ambulatory Visit: Payer: Self-pay

## 2020-10-29 VITALS — BP 164/100 | HR 85 | Ht 63.0 in | Wt 200.2 lb

## 2020-10-29 DIAGNOSIS — Z23 Encounter for immunization: Secondary | ICD-10-CM | POA: Diagnosis not present

## 2020-10-29 DIAGNOSIS — Z1159 Encounter for screening for other viral diseases: Secondary | ICD-10-CM | POA: Diagnosis not present

## 2020-10-29 DIAGNOSIS — M47816 Spondylosis without myelopathy or radiculopathy, lumbar region: Secondary | ICD-10-CM | POA: Diagnosis not present

## 2020-10-29 DIAGNOSIS — N921 Excessive and frequent menstruation with irregular cycle: Secondary | ICD-10-CM

## 2020-10-29 DIAGNOSIS — F17200 Nicotine dependence, unspecified, uncomplicated: Secondary | ICD-10-CM | POA: Diagnosis not present

## 2020-10-29 DIAGNOSIS — E559 Vitamin D deficiency, unspecified: Secondary | ICD-10-CM | POA: Diagnosis not present

## 2020-10-29 DIAGNOSIS — M545 Low back pain, unspecified: Secondary | ICD-10-CM | POA: Diagnosis not present

## 2020-10-29 DIAGNOSIS — I10 Essential (primary) hypertension: Secondary | ICD-10-CM | POA: Diagnosis not present

## 2020-10-29 DIAGNOSIS — R809 Proteinuria, unspecified: Secondary | ICD-10-CM

## 2020-10-29 DIAGNOSIS — Z1231 Encounter for screening mammogram for malignant neoplasm of breast: Secondary | ICD-10-CM

## 2020-10-29 DIAGNOSIS — D219 Benign neoplasm of connective and other soft tissue, unspecified: Secondary | ICD-10-CM

## 2020-10-29 DIAGNOSIS — E2839 Other primary ovarian failure: Secondary | ICD-10-CM

## 2020-10-29 DIAGNOSIS — G8929 Other chronic pain: Secondary | ICD-10-CM

## 2020-10-29 DIAGNOSIS — Z1211 Encounter for screening for malignant neoplasm of colon: Secondary | ICD-10-CM

## 2020-10-29 DIAGNOSIS — Z0000 Encounter for general adult medical examination without abnormal findings: Secondary | ICD-10-CM | POA: Diagnosis not present

## 2020-10-29 HISTORY — DX: Proteinuria, unspecified: R80.9

## 2020-10-29 LAB — POCT URINALYSIS DIP (PROADVANTAGE DEVICE)
Blood, UA: NEGATIVE
Glucose, UA: NEGATIVE mg/dL
Ketones, POC UA: NEGATIVE mg/dL
Leukocytes, UA: NEGATIVE
Nitrite, UA: NEGATIVE
Specific Gravity, Urine: 1.03
Urobilinogen, Ur: NEGATIVE
pH, UA: 5.5 (ref 5.0–8.0)

## 2020-10-29 NOTE — Progress Notes (Signed)
Kristen Shelton is a 64 y.o. female who presents for annual wellness visit and follow-up on chronic medical conditions.  She has the following concerns:  HTN- has not been taking amlodipine. States the pharmacy told her they did not have it but she did not let me know.  She reports having cut back on salt in her diet.    Right knee pain x 4 years  Low back pain - chronic. Recent L/S XR showed advanced facet arthropathy.   Immunization History  Administered Date(s) Administered   PFIZER SARS-COV-2 Vaccination 09/17/2020, 10/08/2020   Td 11/05/2008   Last Pap smear: 2009 Last mammogram: 2009 Last colonoscopy: never Last DEXA: never Dentist: never Ophtho: never  Exercise: sit ups   Other doctors caring for patient include: None  LMP: stopped having cycles July 2021 at age 10. States she has been told this is not normal  She had what appeared to be calcified fibroids on recent XR    Divorced. Her son lives with her. Working 2 days a week at [employer]  Depression screen:  See questionnaire below.  Depression screen [facility] 2/9 10/29/2020 09/17/2020  Decreased Interest 0 0  Down, Depressed, Hopeless 0 0  PHQ - 2 Score 0 0    Fall Risk Screen: see questionnaire below. Fall Risk  10/29/2020 09/17/2020  Falls in the past year? 1 0  Number falls in past yr: 0 0  Injury with Fall? 0 0    ADL screen:  See questionnaire below Functional Status Survey: Is the patient deaf or have difficulty hearing?: No Does the patient have difficulty seeing, even when wearing glasses/contacts?: No Does the patient have difficulty concentrating, remembering, or making decisions?: No Does the patient have difficulty walking or climbing stairs?: Yes Does the patient have difficulty dressing or bathing?: No Does the patient have difficulty doing errands alone such as visiting a doctor's office or shopping?: No   End of Life Discussion:  Patient does not have a living will and medical power of  attorney. Emaree Chiu is her daughter and would be her HCPOA. Paperwork given to take home. MOST form filled out.   Review of Systems Constitutional: -fever, -chills, -sweats, -unexpected weight change, -anorexia, -fatigue Allergy: -sneezing, -itching, -congestion Dermatology: denies changing moles, rash, lumps, new worrisome lesions ENT: -runny nose, -ear pain, -sore throat, -hoarseness, -sinus pain, -teeth pain, -tinnitus, -hearing loss, -epistaxis Cardiology:  -chest pain, -palpitations, -edema, -orthopnea, -paroxysmal nocturnal dyspnea Respiratory: -cough, -shortness of breath, -dyspnea on exertion, -wheezing, -hemoptysis Gastroenterology: -abdominal pain, -nausea, -vomiting, -diarrhea, -constipation, -blood in stool, -changes in bowel movement, -dysphagia Hematology: -bleeding or bruising problems Musculoskeletal: -arthralgias, -myalgias, -joint swelling, +low back pain, -neck pain, -cramping, -gait changes Ophthalmology: -vision changes, -eye redness, -itching, -discharge Urology: -dysuria, -difficulty urinating, -hematuria, -urinary frequency, -urgency, -incontinence Neurology: -headache, -weakness, -tingling, -numbness, -speech abnormality, -memory loss, -falls, -dizziness Psychology:  -depressed mood, -agitation, -sleep problems    PHYSICAL EXAM:  BP (!) 164/100    Pulse 85    Ht 5\' 3"  (1.6 m)    Wt 200 lb 3.2 oz (90.8 kg)    LMP 07/13/2020    BMI 35.46 kg/m   General Appearance: Alert, cooperative, no distress, appears stated age Head: Normocephalic, without obvious abnormality, atraumatic Eyes: PERRL, conjunctiva/corneas clear, EOM's intact Ears: Normal TM's and external ear canals Nose: mask on  Throat: mask on  Neck: Supple, no lymphadenopathy; thyroid: no enlargement/tenderness/nodules; no JVD Back: Spine nontender, no curvature, ROM normal, no CVA tenderness Lungs: Clear to  auscultation bilaterally without wheezes, rales or ronchi; respirations unlabored Chest Wall: No  tenderness or deformity Heart: Regular rate and rhythm, S1 and S2 normal, no murmur, rub or gallop Breast Exam: refer to OB/GYN Abdomen: Soft, non-tender, nondistended, normoactive bowel sounds, no palpable masses.  Genitalia: refer to OB/GYN Extremities: No clubbing, cyanosis or edema Pulses: 2+ and symmetric all extremities Skin: Skin color, texture, turgor normal, no rashes or lesions Lymph nodes: Cervical, supraclavicular, and axillary nodes normal Neurologic: CNII-XII intact, normal strength, sensation and gait; reflexes 2+ and symmetric throughout Psych: Normal mood, affect, hygiene and grooming.  ASSESSMENT/PLAN: Welcome to Medicare preventive visit -This is her second visit with me.  She is here today for a Medicare wellness visit.  Denies any issues with memory, mood, falls, difficulties with ADLs.  Advanced directives discussed  Proteinuria, unspecified type - Plan: Comprehensive metabolic panel, POCT Urinalysis DIP (Proadvantage Device) -Discussed keeping her blood pressure under good control to help protect her kidneys.  She has less protein in her urine today.  We will recheck this again in 4 weeks.  Consider doing a 24-hour urine protein  Uncontrolled hypertension - Plan: CBC with Differential/Platelet, Comprehensive metabolic panel -She has not been on all of her medications.  She will go to the pharmacy and pick up amlodipine and let me know if it is not available.  Discussed potential health consequences associated with uncontrolled hypertension and the importance of getting her blood pressure under good control.  Continue with low-sodium diet.  Encouraged her to get a blood pressure cuff and start checking her blood pressure at home.  Follow-up in 4 weeks.  TOBACCO USER -Chewing tobacco a and is not interested in stopping  Needs flu shot - Plan: Flu Vaccine QUAD High Dose(Fluad)  Encounter for screening mammogram for malignant neoplasm of breast - Plan: MM DIGITAL  SCREENING BILATERAL -She will call and schedule a mammogram and bone density together.  Screen for colon cancer - Plan: Ambulatory referral to Gastroenterology -Referred per guidelines  Estrogen deficiency - Plan: DG Bone Density  Lumbar facet arthropathy - Plan: Ambulatory referral to Orthopedic Surgery -Chronic back pain.  Discussed symptomatic treatment and refer to Ortho  Chronic midline low back pain without sciatica - Plan: Ambulatory referral to Orthopedic Surgery  Fibroids - Plan: Ambulatory referral to Gynecology  Prolonged menstruation - Plan: CBC with Differential/Platelet, Ambulatory referral to Gynecology  Need for hepatitis C screening test - Plan: Hepatitis C antibody -Done per screening guidelines  Vitamin D deficiency - Plan: VITAMIN D 25 Hydroxy (Vit-D Deficiency, Fractures) -Currently taking once weekly vitamin D supplement.  Continue on recheck when she is finished    Discussed monthly self breast exams and yearly mammograms; at least 30 minutes of aerobic activity at least 5 days/week and weight-bearing exercise 2x/week; proper sunscreen use reviewed; healthy diet, including goals of calcium and vitamin D intake and alcohol recommendations (less than or equal to 1 drink/day) reviewed; regular seatbelt use; changing batteries in smoke detectors.  Immunization recommendations discussed.  Colonoscopy recommendations reviewed   Medicare Attestation I have personally reviewed: The patient's medical and social history Their use of alcohol, tobacco or illicit drugs Their current medications and supplements The patient's functional ability including ADLs,fall risks, home safety risks, cognitive, and hearing and visual impairment Diet and physical activities Evidence for depression or mood disorders  The patient's weight, height, and BMI have been recorded in the chart.  I have made referrals, counseling, and provided education to the patient based on review  of the  above and I have provided the patient with a written personalized care plan for preventive services.     Harland Dingwall, NP-C   10/30/2020

## 2020-10-29 NOTE — Patient Instructions (Signed)
°  Ms. Kristen Shelton , Thank you for taking time to come for your Medicare Wellness Visit. I appreciate your ongoing commitment to your health goals. Please review the following plan we discussed and let me know if I can assist you in the future.   These are the goals we discussed:  Your blood pressure is still uncontrolled.  Go to the pharmacy and pick up your amlodipine.  Let me know if they say it is not there.  You will receive a call from [facility] gynecology to schedule an appointment.  You will receive a call from [facility] gastroenterology  You will receive a call from [place] for your low back pain  Call and schedule your mammogram and bone density at the breast center.     This is a list of the screening recommended for you and due dates:  Health Maintenance  Topic Date Due    Hepatitis C: One time screening is recommended by Center for Disease Control  (CDC) for  adults born from [birth year] through 1966.   Never done   Colon Cancer Screening  Never done   Mammogram  11/20/2010   Pap Smear  11/07/2011   Tetanus Vaccine  11/05/2018   Flu Shot  Never done   DEXA scan (bone density measurement)  Never done   Pneumonia vaccines (1 of 2 - PCV13) Never done   COVID-19 Vaccine  Completed   HIV Screening  Completed  The pharmacy and pick up your amlodipine.  Let me know if they say it is not there.

## 2020-10-30 ENCOUNTER — Encounter: Payer: Self-pay | Admitting: Family Medicine

## 2020-10-30 ENCOUNTER — Other Ambulatory Visit: Payer: Self-pay | Admitting: Family Medicine

## 2020-10-30 DIAGNOSIS — R809 Proteinuria, unspecified: Secondary | ICD-10-CM | POA: Diagnosis not present

## 2020-10-30 DIAGNOSIS — F17200 Nicotine dependence, unspecified, uncomplicated: Secondary | ICD-10-CM | POA: Diagnosis not present

## 2020-10-30 DIAGNOSIS — Z23 Encounter for immunization: Secondary | ICD-10-CM

## 2020-10-30 DIAGNOSIS — M545 Low back pain, unspecified: Secondary | ICD-10-CM | POA: Diagnosis not present

## 2020-10-30 DIAGNOSIS — I10 Essential (primary) hypertension: Secondary | ICD-10-CM | POA: Diagnosis not present

## 2020-10-30 DIAGNOSIS — E2839 Other primary ovarian failure: Secondary | ICD-10-CM | POA: Diagnosis not present

## 2020-10-30 DIAGNOSIS — Z0000 Encounter for general adult medical examination without abnormal findings: Secondary | ICD-10-CM | POA: Diagnosis not present

## 2020-10-30 DIAGNOSIS — E559 Vitamin D deficiency, unspecified: Secondary | ICD-10-CM

## 2020-10-30 DIAGNOSIS — Z1231 Encounter for screening mammogram for malignant neoplasm of breast: Secondary | ICD-10-CM | POA: Diagnosis not present

## 2020-10-30 DIAGNOSIS — M47816 Spondylosis without myelopathy or radiculopathy, lumbar region: Secondary | ICD-10-CM | POA: Diagnosis not present

## 2020-10-30 LAB — COMPREHENSIVE METABOLIC PANEL
ALT: 8 IU/L (ref 0–32)
AST: 13 IU/L (ref 0–40)
Albumin/Globulin Ratio: 1.3 (ref 1.2–2.2)
Albumin: 4.3 g/dL (ref 3.8–4.8)
Alkaline Phosphatase: 109 IU/L (ref 44–121)
BUN/Creatinine Ratio: 19 (ref 12–28)
BUN: 22 mg/dL (ref 8–27)
Bilirubin Total: 0.3 mg/dL (ref 0.0–1.2)
CO2: 25 mmol/L (ref 20–29)
Calcium: 9.3 mg/dL (ref 8.7–10.3)
Chloride: 102 mmol/L (ref 96–106)
Creatinine, Ser: 1.15 mg/dL — ABNORMAL HIGH (ref 0.57–1.00)
GFR calc Af Amer: 58 mL/min/{1.73_m2} — ABNORMAL LOW (ref >59)
GFR calc non Af Amer: 50 mL/min/{1.73_m2} — ABNORMAL LOW (ref >59)
Globulin, Total: 3.4 g/dL (ref 1.5–4.5)
Glucose: 99 mg/dL (ref 65–99)
Potassium: 3.6 mmol/L (ref 3.5–5.2)
Sodium: 140 mmol/L (ref 134–144)
Total Protein: 7.7 g/dL (ref 6.0–8.5)

## 2020-10-30 LAB — CBC WITH DIFFERENTIAL/PLATELET
Basophils Absolute: 0 10*3/uL (ref 0.0–0.2)
Basos: 0 %
EOS (ABSOLUTE): 0.2 10*3/uL (ref 0.0–0.4)
Eos: 3 %
Hematocrit: 40.4 % (ref 34.0–46.6)
Hemoglobin: 13.5 g/dL (ref 11.1–15.9)
Immature Grans (Abs): 0 10*3/uL (ref 0.0–0.1)
Immature Granulocytes: 0 %
Lymphocytes Absolute: 1.9 10*3/uL (ref 0.7–3.1)
Lymphs: 35 %
MCH: 29 pg (ref 26.6–33.0)
MCHC: 33.4 g/dL (ref 31.5–35.7)
MCV: 87 fL (ref 79–97)
Monocytes Absolute: 0.3 10*3/uL (ref 0.1–0.9)
Monocytes: 6 %
Neutrophils Absolute: 3 10*3/uL (ref 1.4–7.0)
Neutrophils: 56 %
Platelets: 307 10*3/uL (ref 150–450)
RBC: 4.65 x10E6/uL (ref 3.77–5.28)
RDW: 12.8 % (ref 11.7–15.4)
WBC: 5.3 10*3/uL (ref 3.4–10.8)

## 2020-10-30 LAB — VITAMIN D 25 HYDROXY (VIT D DEFICIENCY, FRACTURES): Vit D, 25-Hydroxy: 10.5 ng/mL — ABNORMAL LOW (ref 30.0–100.0)

## 2020-10-30 LAB — HEPATITIS C ANTIBODY: Hep C Virus Ab: <0.1 s/co ratio (ref 0.0–0.9)

## 2020-10-30 MED ORDER — VITAMIN D (ERGOCALCIFEROL) 1.25 MG (50000 UNIT) PO CAPS: 50000.0000 [IU] | ORAL_CAPSULE | ORAL | 0 refills | Status: DC

## 2020-10-30 NOTE — Progress Notes (Signed)
Her vitamin D level is severely low. I sent in a prescription for her to take ONCE WEEKLY for the next 3 months.  I will see her back in 4 weeks for HTN follow up. Please make sure she can get her amlodipine from the pharmacy. We need to get her BP down.

## 2020-11-03 ENCOUNTER — Other Ambulatory Visit: Payer: Self-pay | Admitting: Family Medicine

## 2020-11-03 DIAGNOSIS — Z1231 Encounter for screening mammogram for malignant neoplasm of breast: Secondary | ICD-10-CM

## 2020-11-24 ENCOUNTER — Telehealth: Payer: Self-pay

## 2020-11-24 MED ORDER — LISINOPRIL 20 MG PO TABS
20.0000 mg | ORAL_TABLET | Freq: Every day | ORAL | 5 refills | Status: DC
Start: 2020-11-24 — End: 2020-11-26

## 2020-11-24 NOTE — Telephone Encounter (Signed)
Received fax for a refill on the pts. Lisinopril pt. Last apt was 10/29/20 and has another apt. 11/26/20.

## 2020-11-24 NOTE — Telephone Encounter (Signed)
done

## 2020-11-25 DIAGNOSIS — E7800 Pure hypercholesterolemia, unspecified: Secondary | ICD-10-CM | POA: Insufficient documentation

## 2020-11-25 DIAGNOSIS — Z6835 Body mass index (BMI) 35.0-35.9, adult: Secondary | ICD-10-CM | POA: Insufficient documentation

## 2020-11-25 NOTE — Progress Notes (Signed)
   Subjective:    Patient ID: Kristen Shelton, female    DOB: [birth year], 64 y.o.   MRN: [id number]  HPI Chief Complaint  Patient presents with  . follow-up    follow-up on HTN   She is here to follow up on HTN and other chronic health conditions.  She reports taking all of her medications daily without any issues.  She is on amlodipine, lisinopril and HCTZ.  Her blood pressures at home have been significantly elevated and she did bring in her blood pressure machine today. She reports cutting way back on salt.  States she no longer has swelling in her feet or ankles.  Reports feeling well and denies headaches, dizziness, chest pain, palpitations, shortness of breath, nausea, vomiting or diarrhea.   Vitamin D def- severe.  I prescribed vitamin D 50,000 IUs for her to take once weekly.  She brought in the empty bottle today and she has taken the medication daily for the past 7 days.  This needs to be rechecked.     Review of Systems Pertinent positives and negatives in the history of present illness.     Objective:   Physical Exam BP (!) 140/100   Pulse 92   Wt 201 lb 3.2 oz (91.3 kg)   LMP 07/13/2020   BMI 35.64 kg/m   Alert and in no distress.  Cardiac exam shows a regular sinus rhythm without murmurs or gallops. Lungs are clear to auscultation.  Extremities without edema.  Skin is warm and dry.       Assessment & Plan:  Uncontrolled hypertension - Plan: lisinopril (ZESTRIL) 40 MG tablet, VAS US RENAL ARTERY DUPLEX  Class 2 severe obesity due to excess calories with serious comorbidity and body mass index (BMI) of 35.0 to 35.9 in adult [facility])  Pure hypercholesterolemia  Vitamin D deficiency - Plan: VITAMIN D 25 Hydroxy (Vit-D Deficiency, Fractures)  Her blood pressure has not responded to the current medication regimen.  She reports taking her medications daily.  She has cut back on sodium.  I will increase lisinopril to 40 mg and she will continue on amlodipine 5 mg  and HCTZ 25 mg. Discussed that we need to look for other contributing factors to her uncontrolled hypertension.  I will order renal and renal artery ultrasound.  We will need to consider sleep apnea as a possibility.  I will also consider referral to cardiology in the hypertension clinic.  She will follow-up with me in 4 weeks.

## 2020-11-26 ENCOUNTER — Ambulatory Visit (INDEPENDENT_AMBULATORY_CARE_PROVIDER_SITE_OTHER): Payer: Medicare HMO | Admitting: Family Medicine

## 2020-11-26 ENCOUNTER — Encounter: Payer: Self-pay | Admitting: Family Medicine

## 2020-11-26 ENCOUNTER — Other Ambulatory Visit: Payer: Self-pay

## 2020-11-26 VITALS — BP 140/100 | HR 92 | Wt 201.2 lb

## 2020-11-26 DIAGNOSIS — E7800 Pure hypercholesterolemia, unspecified: Secondary | ICD-10-CM

## 2020-11-26 DIAGNOSIS — I10 Essential (primary) hypertension: Secondary | ICD-10-CM

## 2020-11-26 DIAGNOSIS — Z6835 Body mass index (BMI) 35.0-35.9, adult: Secondary | ICD-10-CM

## 2020-11-26 DIAGNOSIS — E559 Vitamin D deficiency, unspecified: Secondary | ICD-10-CM

## 2020-11-26 MED ORDER — LISINOPRIL 40 MG PO TABS: 40.0000 mg | ORAL_TABLET | Freq: Every day | ORAL | 1 refills | Status: DC

## 2020-11-26 NOTE — Patient Instructions (Signed)
I am increasing your lisinopril dose from 20 mg to 40 mg. Continue taking your amlodipine and Hydrochlorothiazide. Continue eating a low sodium diet.

## 2020-11-27 ENCOUNTER — Encounter: Payer: Self-pay | Admitting: Family Medicine

## 2020-11-27 LAB — VITAMIN D 25 HYDROXY (VIT D DEFICIENCY, FRACTURES): Vit D, 25-Hydroxy: 61.4 ng/mL (ref 30.0–100.0)

## 2020-11-27 NOTE — Progress Notes (Signed)
Her vitamin D level is now in a good range.  I recommend that she only take over-the-counter vitamin D 3 1000 international units daily.  She can get this in a women's One-A-Day multivitamin if she prefers

## 2020-12-02 ENCOUNTER — Encounter: Payer: Self-pay | Admitting: Obstetrics and Gynecology

## 2020-12-02 ENCOUNTER — Ambulatory Visit (INDEPENDENT_AMBULATORY_CARE_PROVIDER_SITE_OTHER): Payer: Medicare HMO | Admitting: Obstetrics and Gynecology

## 2020-12-02 ENCOUNTER — Other Ambulatory Visit: Payer: Self-pay

## 2020-12-02 VITALS — BP 144/90 | Ht 62.0 in | Wt 201.0 lb

## 2020-12-02 DIAGNOSIS — Z124 Encounter for screening for malignant neoplasm of cervix: Secondary | ICD-10-CM

## 2020-12-02 DIAGNOSIS — R102 Pelvic and perineal pain: Secondary | ICD-10-CM

## 2020-12-02 DIAGNOSIS — Z9189 Other specified personal risk factors, not elsewhere classified: Secondary | ICD-10-CM

## 2020-12-02 DIAGNOSIS — Z780 Asymptomatic menopausal state: Secondary | ICD-10-CM

## 2020-12-02 DIAGNOSIS — Z01419 Encounter for gynecological examination (general) (routine) without abnormal findings: Secondary | ICD-10-CM | POA: Diagnosis not present

## 2020-12-02 DIAGNOSIS — Z13820 Encounter for screening for osteoporosis: Secondary | ICD-10-CM

## 2020-12-02 NOTE — Progress Notes (Signed)
Kristen Shelton [birth year] [id number]  SUBJECTIVE:  64 y.o. G3P3 female new patient here for a breast and pelvic exam and Pap smear. She has no gynecologic concerns.  Many year history of occasional mild lower abdominal/pelvic discomfort.  Symptoms do not bother her much.  No vaginal discharge or bleeding.  Current Outpatient Medications  Medication Sig Dispense Refill  . amLODipine (NORVASC) 5 MG tablet Take 1 tablet (5 mg total) by mouth daily. 30 tablet 2  . hydrochlorothiazide (HYDRODIURIL) 25 MG tablet Take 1 tablet (25 mg total) by mouth daily. 30 tablet 2  . lisinopril (ZESTRIL) 40 MG tablet Take 1 tablet (40 mg total) by mouth daily. 30 tablet 1  . VITAMIN D PO Take by mouth.     No current facility-administered medications for this visit.   Allergies: Patient has no known allergies.  Patient's last menstrual period was 07/13/2020.  Past medical history,surgical history, problem list, medications, allergies, family history and social history were all reviewed and documented as reviewed in the EPIC chart.  GYN ROS: no abnormal bleeding, pelvic pain or discharge, no breast pain or new or enlarging lumps on self exam.  No dysuria, urinary frequency, pain with urination, cloudy/malodorous urine.   OBJECTIVE:  BP (!) 144/90   Ht 5\' 2"  (1.575 m)   Wt 201 lb (91.2 kg)   LMP 07/13/2020   BMI 36.76 kg/m  The patient appears well, alert, oriented, in no distress.  BREAST EXAM: breasts appear normal, no suspicious masses, no skin or nipple changes or axillary nodes  PELVIC EXAM: VULVA: normal appearing vulva with atrophic change, no masses, tenderness or lesions, VAGINA: normal appearing vagina with atrophic change, normal color and discharge, no lesions, CERVIX: normal appearing atrophic cervix without discharge or lesions, UTERUS: Generous size uterus approximately 10 to 12 weeks in size overall with a fundal fibroid palpable off to the left aspect of the uterus, overall uterus is  nontender and normal consistency, ADNEXA: Limited palpation of the left adnexa due to the fibroid, but no tenderness, right adnexa feels normal without masses nor tenderness.  PAP: Pap smear done today, thin-prep method  Chaperone: Caryn Bee present during the examination  ASSESSMENT:  64 y.o. G3P3 here for a breast and pelvic exam  PLAN:   1. Postmenopausal.  No significant hot flashes or night sweats.  No vaginal bleeding. 2.  Suspected uterine fibroids.  Could be part of the occasional abdominal and pelvic discomfort she experiences.  Symptoms are not bothering her very much and have been longstanding and not getting any worse.  She says she also was told that she had a hernia in the abdominal wall.  I told her that if she develops any worsening pain symptoms that we could consider getting a pelvic ultrasound, or if there is any vaginal bleeding.  Pelvic exam is otherwise unremarkable today and she is not having any pain on palpation so she is comfortable with expectant management for now.  I do recommend an annual pelvic exam at the very least to keep track of the uterine size and to ensure the fibroids are not growing. 3. Pap smear last in 2009 or so.  We did collect a Pap smear today.  She does not recall any abnormal Pap smear history. 3. Mammogram scheduled for 01/2020.  Normal breast exam today.   4. Colonoscopy never.  Discussed the importance of colon cancer screening and early detection afforded by this, and I recommend that she pursue getting a colonoscopy done.  Names and contacts are available upon request at checkout so she can get that scheduled. 5. DEXA never.  Reviewed recommendations for vitamin D and calcium and weightbearing exercise.  She says she has a DEXA scheduled with her mammogram. 6. Health maintenance.  No labs today as she has these completed elsewhere.  She is mildly hypertensive today and is followed by her PCP for hypertension.  I did recommend that she follow-up  there for further blood pressure management recommendations, and does have an appointment coming up with her PCP on 12/24/2020.  Return annually or sooner, prn.  Joseph Pierini MD 12/02/20

## 2020-12-03 LAB — PAP IG W/ RFLX HPV ASCU

## 2020-12-03 NOTE — Progress Notes (Signed)
Pt was going to give them a call today to schedule with GI

## 2020-12-04 ENCOUNTER — Other Ambulatory Visit: Payer: Self-pay

## 2020-12-04 MED ORDER — METRONIDAZOLE 500 MG PO TABS: 500.0000 mg | ORAL_TABLET | Freq: Two times a day (BID) | ORAL | 0 refills | Status: DC

## 2020-12-08 ENCOUNTER — Encounter: Payer: Self-pay | Admitting: Gastroenterology

## 2020-12-15 ENCOUNTER — Ambulatory Visit (AMBULATORY_SURGERY_CENTER): Payer: Self-pay | Admitting: *Deleted

## 2020-12-15 ENCOUNTER — Other Ambulatory Visit: Payer: Self-pay

## 2020-12-15 VITALS — Ht 63.0 in | Wt 200.0 lb

## 2020-12-15 DIAGNOSIS — Z1211 Encounter for screening for malignant neoplasm of colon: Secondary | ICD-10-CM

## 2020-12-15 MED ORDER — NA SULFATE-K SULFATE-MG SULF 17.5-3.13-1.6 GM/177ML PO SOLN: ORAL | 0 refills | Status: DC

## 2020-12-15 NOTE — Progress Notes (Signed)
Patient is here in-person for PV. Patient denies any allergies to eggs or soy. Patient denies any problems with anesthesia/sedation. Patient denies any oxygen use at home. Patient denies taking any diet/weight loss medications or blood thinners. Patient is not being treated for MRSA or C-diff. Patient is aware of our care-partner policy and FSFSE-39 safety protocol. EMMI education assigned to the patient for the procedure, handout given to pt.  COVID-19 vaccines completed on 10/08/2020 x2, per patient. Patient takes Magnesium Citrate PRN for constipation. Explained to pt to take this on Wed. Before starting prep on thurs. Pt verbalizes understanding. Patient aware NOT to chew tobacco after MN day of exam!

## 2020-12-16 ENCOUNTER — Ambulatory Visit (HOSPITAL_COMMUNITY)
Admission: RE | Admit: 2020-12-16 | Payer: Medicare HMO | Source: Ambulatory Visit | Attending: Family Medicine | Admitting: Family Medicine

## 2020-12-23 ENCOUNTER — Encounter (HOSPITAL_COMMUNITY): Payer: Medicare HMO

## 2020-12-24 ENCOUNTER — Ambulatory Visit: Payer: Medicare HMO | Admitting: Family Medicine

## 2020-12-29 ENCOUNTER — Ambulatory Visit: Payer: Medicare HMO | Admitting: Obstetrics and Gynecology

## 2020-12-29 ENCOUNTER — Ambulatory Visit: Payer: Medicare HMO | Admitting: Family Medicine

## 2020-12-31 ENCOUNTER — Ambulatory Visit: Payer: Medicare HMO | Admitting: Obstetrics and Gynecology

## 2020-12-31 ENCOUNTER — Ambulatory Visit (HOSPITAL_COMMUNITY)
Admission: RE | Admit: 2020-12-31 | Discharge: 2020-12-31 | Disposition: A | Discharge status: Home / Self Care | Payer: Medicare HMO | Source: Ambulatory Visit | Attending: Cardiology | Admitting: Cardiology

## 2020-12-31 ENCOUNTER — Other Ambulatory Visit: Payer: Self-pay

## 2020-12-31 DIAGNOSIS — I10 Essential (primary) hypertension: Secondary | ICD-10-CM | POA: Diagnosis not present

## 2021-01-01 ENCOUNTER — Ambulatory Visit: Payer: Medicare HMO | Admitting: Obstetrics and Gynecology

## 2021-01-01 ENCOUNTER — Encounter: Payer: Self-pay | Admitting: Family Medicine

## 2021-01-07 ENCOUNTER — Other Ambulatory Visit: Payer: Self-pay

## 2021-01-07 ENCOUNTER — Encounter: Payer: Self-pay | Admitting: Family Medicine

## 2021-01-07 ENCOUNTER — Ambulatory Visit (INDEPENDENT_AMBULATORY_CARE_PROVIDER_SITE_OTHER): Payer: Medicare HMO | Admitting: Family Medicine

## 2021-01-07 VITALS — BP 152/92 | HR 55 | Temp 97.2°F | Wt 200.8 lb

## 2021-01-07 DIAGNOSIS — R7989 Other specified abnormal findings of blood chemistry: Secondary | ICD-10-CM

## 2021-01-07 DIAGNOSIS — I10 Essential (primary) hypertension: Secondary | ICD-10-CM | POA: Diagnosis not present

## 2021-01-07 DIAGNOSIS — E669 Obesity, unspecified: Secondary | ICD-10-CM | POA: Diagnosis not present

## 2021-01-07 DIAGNOSIS — Q6102 Congenital multiple renal cysts: Secondary | ICD-10-CM | POA: Diagnosis not present

## 2021-01-07 LAB — CBC WITH DIFFERENTIAL/PLATELET
Basophils Absolute: 0 10*3/uL (ref 0.0–0.2)
Basos: 0 %
EOS (ABSOLUTE): 0.1 10*3/uL (ref 0.0–0.4)
Eos: 2 %
Hematocrit: 37.4 % (ref 34.0–46.6)
Hemoglobin: 12.4 g/dL (ref 11.1–15.9)
Immature Grans (Abs): 0 10*3/uL (ref 0.0–0.1)
Immature Granulocytes: 0 %
Lymphocytes Absolute: 2 10*3/uL (ref 0.7–3.1)
Lymphs: 33 %
MCH: 28.6 pg (ref 26.6–33.0)
MCHC: 33.2 g/dL (ref 31.5–35.7)
MCV: 86 fL (ref 79–97)
Monocytes Absolute: 0.4 10*3/uL (ref 0.1–0.9)
Monocytes: 7 %
Neutrophils Absolute: 3.5 10*3/uL (ref 1.4–7.0)
Neutrophils: 58 %
Platelets: 291 10*3/uL (ref 150–450)
RBC: 4.33 x10E6/uL (ref 3.77–5.28)
RDW: 13 % (ref 11.7–15.4)
WBC: 6 10*3/uL (ref 3.4–10.8)

## 2021-01-07 LAB — COMPREHENSIVE METABOLIC PANEL
ALT: 9 IU/L (ref 0–32)
AST: 14 IU/L (ref 0–40)
Albumin/Globulin Ratio: 1.2 (ref 1.2–2.2)
Albumin: 4.1 g/dL (ref 3.8–4.8)
Alkaline Phosphatase: 89 IU/L (ref 44–121)
BUN/Creatinine Ratio: 14 (ref 12–28)
BUN: 19 mg/dL (ref 8–27)
Bilirubin Total: 0.3 mg/dL (ref 0.0–1.2)
CO2: 26 mmol/L (ref 20–29)
Calcium: 9.3 mg/dL (ref 8.7–10.3)
Chloride: 101 mmol/L (ref 96–106)
Creatinine, Ser: 1.38 mg/dL — ABNORMAL HIGH (ref 0.57–1.00)
GFR calc Af Amer: 46 mL/min/{1.73_m2} — ABNORMAL LOW (ref >59)
GFR calc non Af Amer: 40 mL/min/{1.73_m2} — ABNORMAL LOW (ref >59)
Globulin, Total: 3.5 g/dL (ref 1.5–4.5)
Glucose: 100 mg/dL — ABNORMAL HIGH (ref 65–99)
Potassium: 4.2 mmol/L (ref 3.5–5.2)
Sodium: 140 mmol/L (ref 134–144)
Total Protein: 7.6 g/dL (ref 6.0–8.5)

## 2021-01-07 MED ORDER — AMLODIPINE BESYLATE 5 MG PO TABS: 5.0000 mg | ORAL_TABLET | Freq: Every day | ORAL | 2 refills | Status: DC

## 2021-01-07 MED ORDER — HYDROCHLOROTHIAZIDE 25 MG PO TABS: 25.0000 mg | ORAL_TABLET | Freq: Every day | ORAL | 2 refills | Status: DC

## 2021-01-07 NOTE — Progress Notes (Signed)
Discussed in person. Referral to urology.

## 2021-01-07 NOTE — Patient Instructions (Addendum)
Your blood pressure is still not well controlled.  Continue taking all of your medications.  I will refill these today.  Continue eating a low-sodium diet but do not use "no salt" because it is high in potassium.  Your ultrasound of your kidneys did not show anything worrisome but, it did show that you have 2 cysts on your left kidney.  I will refer you to a urologist so that they can evaluate this and determine if it is affecting your blood pressure or not.  I am also referring you to the cardiologist so that they can help get your blood pressure under better control.  I will be in touch with your lab results.  Avoid bananas if this is the only food causing you indigestion.

## 2021-01-07 NOTE — Progress Notes (Signed)
   Subjective:    Patient ID: Kristen Shelton, female    DOB: [birth year], 65 y.o.   MRN: [id number]  HPI Chief Complaint  Patient presents with  . Follow-up    B/P follow up   She is here to follow up on uncontrolled HTN and abnormal renal ultrasound.  No RAS noted on ultrasound but her left kidney does have 2 large cysts.  States her blood pressures at home have not improved.  Reports good compliance with her medications.  States she has been avoiding salt as much as she can.   Denies fever, chills, dizziness, chest pain, palpitations, shortness of breath, abdominal pain, N/V/D, urinary symptoms, LE edema.   States bananas are causing her to have indigestion but nothing else seems to be bothering her.     Review of Systems Pertinent positives and negatives in the history of present illness.     Objective:   Physical Exam BP (!) 152/92   Pulse (!) 55   Temp (!) 97.2 F (36.2 C)   Wt 200 lb 12.8 oz (91.1 kg)   LMP 07/13/2020   SpO2 99%   BMI 35.57 kg/m   Alert and in no distress.  Cardiac exam shows a regular sinus rhythm without murmurs or gallops. Lungs are clear to auscultation.      Assessment & Plan:  Multiple renal cysts - Plan: Ambulatory referral to Urology  Uncontrolled hypertension - Plan: CBC with Differential/Platelet, Comprehensive metabolic panel, amLODipine (NORVASC) 5 MG tablet, hydrochlorothiazide (HYDRODIURIL) 25 MG tablet, Ambulatory referral to [facility]  Obesity (BMI 30-39.9)  Elevated serum creatinine - Plan: CBC with Differential/Platelet, Comprehensive metabolic panel, Ambulatory referral to Urology  I will refer her to urology for evaluation due to large renal cysts. We have not made any headway with lowering her blood pressure.  She is on 3 medications and is eating a low-sodium diet and there has been no improvement in blood pressures.  I am referring her to the hypertension clinic for  assistance. Follow-up in 3 months or sooner if needed.

## 2021-01-12 NOTE — Progress Notes (Signed)
Her kidney function was slightly worse with her last blood draw. Make sure she is staying well hydrated with water. Her urine should be a very light yellow. Please ask her if she has a urology appointment scheduled for the kidney cysts.

## 2021-01-15 ENCOUNTER — Other Ambulatory Visit: Payer: Self-pay

## 2021-01-15 ENCOUNTER — Encounter: Payer: Self-pay | Admitting: Gastroenterology

## 2021-01-15 ENCOUNTER — Other Ambulatory Visit: Payer: Self-pay | Admitting: Gastroenterology

## 2021-01-15 ENCOUNTER — Ambulatory Visit (AMBULATORY_SURGERY_CENTER): Payer: Medicare HMO | Admitting: Gastroenterology

## 2021-01-15 VITALS — BP 129/68 | HR 61 | Temp 97.3°F | Resp 14 | Ht 63.0 in | Wt 200.0 lb

## 2021-01-15 DIAGNOSIS — D122 Benign neoplasm of ascending colon: Secondary | ICD-10-CM

## 2021-01-15 DIAGNOSIS — D124 Benign neoplasm of descending colon: Secondary | ICD-10-CM | POA: Diagnosis not present

## 2021-01-15 DIAGNOSIS — Z1211 Encounter for screening for malignant neoplasm of colon: Secondary | ICD-10-CM | POA: Diagnosis not present

## 2021-01-15 DIAGNOSIS — I10 Essential (primary) hypertension: Secondary | ICD-10-CM | POA: Diagnosis not present

## 2021-01-15 MED ORDER — SODIUM CHLORIDE 0.9 % IV SOLN: 500.0000 mL | Freq: Once | INTRAVENOUS | Status: DC

## 2021-01-15 NOTE — Progress Notes (Signed)
Report to PACU, RN, vss, BBS= Clear.  

## 2021-01-15 NOTE — Progress Notes (Signed)
Called to room to assist during endoscopic procedure.  Patient ID and intended procedure confirmed with present staff. Received instructions for my participation in the procedure from the performing physician.  

## 2021-01-15 NOTE — Patient Instructions (Signed)
Discharge instructions given. Handouts on polyps and hemorrhoids. Resume previous medications. YOU HAD AN ENDOSCOPIC PROCEDURE TODAY AT THE  ENDOSCOPY CENTER:   Refer to the procedure report that was given to you for any specific questions about what was found during the examination.  If the procedure report does not answer your questions, please call your gastroenterologist to clarify.  If you requested that your care partner not be given the details of your procedure findings, then the procedure report has been included in a sealed envelope for you to review at your convenience later.  YOU SHOULD EXPECT: Some feelings of bloating in the abdomen. Passage of more gas than usual.  Walking can help get rid of the air that was put into your GI tract during the procedure and reduce the bloating. If you had a lower endoscopy (such as a colonoscopy or flexible sigmoidoscopy) you may notice spotting of blood in your stool or on the toilet paper. If you underwent a bowel prep for your procedure, you may not have a normal bowel movement for a few days.  Please Note:  You might notice some irritation and congestion in your nose or some drainage.  This is from the oxygen used during your procedure.  There is no need for concern and it should clear up in a day or so.  SYMPTOMS TO REPORT IMMEDIATELY:  Following lower endoscopy (colonoscopy or flexible sigmoidoscopy):  Excessive amounts of blood in the stool  Significant tenderness or worsening of abdominal pains  Swelling of the abdomen that is new, acute  Fever of 100F or higher   For urgent or emergent issues, a gastroenterologist can be reached at any hour by calling [phone]. Do not use MyChart messaging for urgent concerns.    DIET:  We do recommend a small meal at first, but then you may proceed to your regular diet.  Drink plenty of fluids but you should avoid alcoholic beverages for 24 hours.  ACTIVITY:  You should plan to take it  easy for the rest of today and you should NOT DRIVE or use heavy machinery until tomorrow (because of the sedation medicines used during the test).    FOLLOW UP: Our staff will call the number listed on your records 48-72 hours following your procedure to check on you and address any questions or concerns that you may have regarding the information given to you following your procedure. If we do not reach you, we will leave a message.  We will attempt to reach you two times.  During this call, we will ask if you have developed any symptoms of COVID 19. If you develop any symptoms (ie: fever, flu-like symptoms, shortness of breath, cough etc.) before then, please call [phone].  If you test positive for Covid 19 in the 2 weeks post procedure, please call and report this information to us.    If any biopsies were taken you will be contacted by phone or by letter within the next 1-3 weeks.  Please call us at [phone] if you have not heard about the biopsies in 3 weeks.    SIGNATURES/CONFIDENTIALITY: You and/or your care partner have signed paperwork which will be entered into your electronic medical record.  These signatures attest to the fact that that the information above on your After Visit Summary has been reviewed and is understood.  Full responsibility of the confidentiality of this discharge information lies with you and/or your care-partner.  

## 2021-01-15 NOTE — Progress Notes (Signed)
Pt's states no medical or surgical changes since previsit or office visit.  VS CW  

## 2021-01-15 NOTE — Op Note (Signed)
[facility] Patient Name: Kristen Shelton Procedure Date: 01/15/2021 12:58 PM MRN: [id number] Endoscopist: Ladene Artist , MD Age: 65 Referring MD:  Date of Birth: 02-26-1956 Gender: Female Account #: [id number] Procedure:                Colonoscopy Indications:              Screening for colorectal malignant neoplasm Medicines:                Monitored Anesthesia Care Procedure:                Pre-Anesthesia Assessment:                           - Prior to the procedure, a History and Physical                            was performed, and patient medications and                            allergies were reviewed. The patient's tolerance of                            previous anesthesia was also reviewed. The risks                            and benefits of the procedure and the sedation                            options and risks were discussed with the patient.                            All questions were answered, and informed consent                            was obtained. Prior Anticoagulants: The patient has                            taken no previous anticoagulant or antiplatelet                            agents. ASA Grade Assessment: II - A patient with                            mild systemic disease. After reviewing the risks                            and benefits, the patient was deemed in                            satisfactory condition to undergo the procedure.                           After obtaining informed consent, the colonoscope  was passed under direct vision. Throughout the                            procedure, the patient's blood pressure, pulse, and                            oxygen saturations were monitored continuously. The                            Olympus CF-HQ190 [id number]) Colonoscope was                            introduced through the anus and advanced to the the                            cecum, identified  by appendiceal orifice and                            ileocecal valve. The ileocecal valve, appendiceal                            orifice, and rectum were photographed. The quality                            of the bowel preparation was fair despite extensive                            lavage and suction. The colonoscopy was performed                            without difficulty. The patient tolerated the                            procedure well. Scope In: 1:26:12 PM Scope Out: 1:47:55 PM Scope Withdrawal Time: 0 hours 17 minutes 7 seconds  Total Procedure Duration: 0 hours 21 minutes 43 seconds  Findings:                 The perianal and digital rectal examinations were                            normal.                           Two sessile polyps were found in the descending                            colon and ascending colon. The polyps were 6 mm in                            size. These polyps were removed with a cold snare.                            Resection and retrieval were complete.  Internal hemorrhoids were found during                            retroflexion. The hemorrhoids were moderate and                            Grade I (internal hemorrhoids that do not prolapse).                           The exam was otherwise without abnormality on                            direct and retroflexion views. Complications:            No immediate complications. Estimated blood loss:                            None. Estimated Blood Loss:     Estimated blood loss: none. Impression:               - Preparation of the colon was fair.                           - Two 6 mm polyps in the descending colon and in                            the ascending colon, removed with a cold snare.                            Resected and retrieved.                           - Internal hemorrhoids.                           - The examination was otherwise normal on direct                             and retroflexion views. Recommendation:           - Repeat colonoscopy in 3 years due the fair prep                            for surveillance based on pathology results with a                            more extensive bowel prep.                           - Patient has a contact number available for                            emergencies. The signs and symptoms of potential                            delayed complications were discussed with the  patient. Return to normal activities tomorrow.                            Written discharge instructions were provided to the                            patient.                           - Resume previous diet.                           - Continue present medications.                           - Await pathology results. Ladene Artist, MD 01/15/2021 1:51:21 PM This report has been signed electronically.

## 2021-01-16 ENCOUNTER — Encounter: Payer: Medicare HMO | Admitting: Gastroenterology

## 2021-01-19 ENCOUNTER — Telehealth: Payer: Self-pay | Admitting: *Deleted

## 2021-01-19 NOTE — Telephone Encounter (Signed)
Attempted f/u phone call. No answer. Left message. °

## 2021-01-19 NOTE — Telephone Encounter (Signed)
  Follow up Call-  Call back number 01/15/2021  Post procedure Call Back phone  # [phone]  Permission to leave phone message Yes  Some recent data might be hidden     Patient questions:  Do you have a fever, pain , or abdominal swelling? No. Pain Score  0 *  Have you tolerated food without any problems? Yes.    Have you been able to return to your normal activities? Yes.    Do you have any questions about your discharge instructions: Diet   No. Medications  No. Follow up visit  No.  Do you have questions or concerns about your Care? No.  Actions: * If pain score is 4 or above: No action needed, pain <4.  1. Have you developed a fever since your procedure? no  2.   Have you had an respiratory symptoms (SOB or cough) since your procedure? no  3.   Have you tested positive for COVID 19 since your procedure no  4.   Have you had any family members/close contacts diagnosed with the COVID 19 since your procedure?  no   If yes to any of these questions please route to Joylene John, RN and Joella Prince, RN

## 2021-01-28 ENCOUNTER — Encounter: Payer: Self-pay | Admitting: Gastroenterology

## 2021-02-01 ENCOUNTER — Other Ambulatory Visit: Payer: Self-pay | Admitting: Family Medicine

## 2021-02-01 DIAGNOSIS — I10 Essential (primary) hypertension: Secondary | ICD-10-CM

## 2021-02-12 ENCOUNTER — Ambulatory Visit
Admission: RE | Admit: 2021-02-12 | Discharge: 2021-02-12 | Disposition: A | Discharge status: Home / Self Care | Payer: Medicare HMO | Source: Ambulatory Visit | Attending: Family Medicine | Admitting: Family Medicine

## 2021-02-12 ENCOUNTER — Other Ambulatory Visit: Payer: Self-pay

## 2021-02-12 DIAGNOSIS — E2839 Other primary ovarian failure: Secondary | ICD-10-CM

## 2021-02-12 DIAGNOSIS — Z1231 Encounter for screening mammogram for malignant neoplasm of breast: Secondary | ICD-10-CM

## 2021-02-12 DIAGNOSIS — Z780 Asymptomatic menopausal state: Secondary | ICD-10-CM | POA: Diagnosis not present

## 2021-02-12 DIAGNOSIS — M85852 Other specified disorders of bone density and structure, left thigh: Secondary | ICD-10-CM | POA: Diagnosis not present

## 2021-02-15 ENCOUNTER — Encounter: Payer: Self-pay | Admitting: Family Medicine

## 2021-02-15 DIAGNOSIS — M8580 Other specified disorders of bone density and structure, unspecified site: Secondary | ICD-10-CM

## 2021-02-15 HISTORY — DX: Other specified disorders of bone density and structure, unspecified site: M85.80

## 2021-02-15 NOTE — Progress Notes (Signed)
Her bone density test shows that her bones are thinning called osteopenia but she does not have osteoporosis yet.  It is recommended that she get plenty of calcium in her diet, 1200 mg/day.  She should also be on a vitamin D supplement and make sure she is getting weightbearing exercises such as walking at least 150 minutes/week.

## 2021-02-19 ENCOUNTER — Telehealth: Payer: Self-pay

## 2021-02-19 DIAGNOSIS — I10 Essential (primary) hypertension: Secondary | ICD-10-CM

## 2021-02-19 MED ORDER — HYDROCHLOROTHIAZIDE 25 MG PO TABS: 25.0000 mg | ORAL_TABLET | Freq: Every day | ORAL | 0 refills | Status: DC

## 2021-02-19 MED ORDER — LISINOPRIL 40 MG PO TABS: 40.0000 mg | ORAL_TABLET | Freq: Every day | ORAL | 0 refills | Status: DC

## 2021-02-19 MED ORDER — AMLODIPINE BESYLATE 5 MG PO TABS: 5.0000 mg | ORAL_TABLET | Freq: Every day | ORAL | 0 refills | Status: DC

## 2021-02-19 NOTE — Addendum Note (Signed)
Addended by: Minette Headland A on: 02/19/2021 11:52 AM   Modules accepted: Orders

## 2021-02-19 NOTE — Telephone Encounter (Signed)
Received a fax from [facility] for a refill on the pts. Lisinopril, hctz, and Amlodipine pt. Last apt was 01/07/21.

## 2021-02-19 NOTE — Telephone Encounter (Signed)
Tried to call pt but phone not working. I went ahead and filled med

## 2021-02-19 NOTE — Telephone Encounter (Signed)
Pt has appt with cardiology in march. Does she need to wait until seeing cardiology

## 2021-02-19 NOTE — Telephone Encounter (Signed)
See if she is going to run out of medications before then and ok to give her refill if so.

## 2021-02-27 ENCOUNTER — Other Ambulatory Visit: Payer: Self-pay

## 2021-02-27 ENCOUNTER — Encounter: Payer: Self-pay | Admitting: Cardiovascular Disease

## 2021-02-27 ENCOUNTER — Ambulatory Visit (INDEPENDENT_AMBULATORY_CARE_PROVIDER_SITE_OTHER): Payer: Medicare HMO | Admitting: Cardiovascular Disease

## 2021-02-27 VITALS — BP 132/86 | HR 73 | Ht 61.0 in | Wt 201.0 lb

## 2021-02-27 DIAGNOSIS — I10 Essential (primary) hypertension: Secondary | ICD-10-CM | POA: Diagnosis not present

## 2021-02-27 DIAGNOSIS — E7800 Pure hypercholesterolemia, unspecified: Secondary | ICD-10-CM

## 2021-02-27 DIAGNOSIS — E669 Obesity, unspecified: Secondary | ICD-10-CM | POA: Diagnosis not present

## 2021-02-27 MED ORDER — NICOTINE 21 MG/24HR TD PT24: 21.0000 mg | MEDICATED_PATCH | Freq: Every day | TRANSDERMAL | 0 refills | Status: DC

## 2021-02-27 MED ORDER — NICOTINE 7 MG/24HR TD PT24: 7.0000 mg | MEDICATED_PATCH | Freq: Every day | TRANSDERMAL | 0 refills | Status: DC

## 2021-02-27 MED ORDER — NICOTINE 14 MG/24HR TD PT24: 14.0000 mg | MEDICATED_PATCH | Freq: Every day | TRANSDERMAL | 0 refills | Status: DC

## 2021-02-27 MED ORDER — AMLODIPINE BESYLATE 10 MG PO TABS: 10.0000 mg | ORAL_TABLET | Freq: Every day | ORAL | 3 refills | Status: DC
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
## 2021-02-27 NOTE — Patient Instructions (Addendum)
Medication Instructions:  INCREASE AMLODIPINE TO 10 MG DAILY   USE NICOTINE PATCH 21 MG DAILY FOR 6 WEEKS, THEN DECREASE TO 14 MG DAILY FOR 2 WEEKS, AND THEN 7 MG DAILY FOR 2 WEEKS   Labwork: NONE   Testing/Procedures: NONE   Follow-Up: 03/31/2021 AT 9:30 AM WITH PHARM D    AMY THE CAREGUIDE WILL CALL YOU REGARDING SMOKING   You will receive a phone call from the [facility] exercise and nutrition program to schedule an initial assessment.   Special Instructions:   MONITOR YOUR BLOOD PRESSURE TWICE A DAY, LOG IN THE BOOK PROVIDED. BRING THE BOOK AND YOUR BLOOD PRESSURE MACHINE TO YOUR FOLLOW UP IN 1 MONTH   DASH Eating Plan DASH stands for "Dietary Approaches to Stop Hypertension." The DASH eating plan is a healthy eating plan that has been shown to reduce high blood pressure (hypertension). It may also reduce your risk for type 2 diabetes, heart disease, and stroke. The DASH eating plan may also help with weight loss. What are tips for following this plan?  General guidelines  Avoid eating more than 2,300 mg (milligrams) of salt (sodium) a day. If you have hypertension, you may need to reduce your sodium intake to 1,500 mg a day.  Limit alcohol intake to no more than 1 drink a day for nonpregnant women and 2 drinks a day for men. One drink equals 12 oz of beer, 5 oz of wine, or 1 oz of hard liquor.  Work with your health care provider to maintain a healthy body weight or to lose weight. Ask what an ideal weight is for you.  Get at least 30 minutes of exercise that causes your heart to beat faster (aerobic exercise) most days of the week. Activities may include walking, swimming, or biking.  Work with your health care provider or diet and nutrition specialist (dietitian) to adjust your eating plan to your individual calorie needs. Reading food labels   Check food labels for the amount of sodium per serving. Choose foods with less than 5 percent of the Daily Value of sodium. Generally,  foods with less than 300 mg of sodium per serving fit into this eating plan.  To find whole grains, look for the word "whole" as the first word in the ingredient list. Shopping  Buy products labeled as "low-sodium" or "no salt added."  Buy fresh foods. Avoid canned foods and premade or frozen meals. Cooking  Avoid adding salt when cooking. Use salt-free seasonings or herbs instead of table salt or sea salt. Check with your health care provider or pharmacist before using salt substitutes.  Do not fry foods. Cook foods using healthy methods such as baking, boiling, grilling, and broiling instead.  Cook with heart-healthy oils, such as olive, canola, soybean, or sunflower oil. Meal planning  Eat a balanced diet that includes: ? 5 or more servings of fruits and vegetables each day. At each meal, try to fill half of your plate with fruits and vegetables. ? Up to 6-8 servings of whole grains each day. ? Less than 6 oz of lean meat, poultry, or fish each day. A 3-oz serving of meat is about the same size as a deck of cards. One egg equals 1 oz. ? 2 servings of low-fat dairy each day. ? A serving of nuts, seeds, or beans 5 times each week. ? Heart-healthy fats. Healthy fats called Omega-3 fatty acids are found in foods such as flaxseeds and coldwater fish, like sardines, salmon, and mackerel.  Limit how much you eat of the following: ? Canned or prepackaged foods. ? Food that is high in trans fat, such as fried foods. ? Food that is high in saturated fat, such as fatty meat. ? Sweets, desserts, sugary drinks, and other foods with added sugar. ? Full-fat dairy products.  Do not salt foods before eating.  Try to eat at least 2 vegetarian meals each week.  Eat more home-cooked food and less restaurant, buffet, and fast food.  When eating at a restaurant, ask that your food be prepared with less salt or no salt, if possible. What foods are recommended? The items listed may not be a  complete list. Talk with your dietitian about what dietary choices are best for you. Grains Whole-grain or whole-wheat bread. Whole-grain or whole-wheat pasta. Brown rice. [place]. Bulgur. Whole-grain and low-sodium cereals. Pita bread. Low-fat, low-sodium crackers. Whole-wheat flour tortillas. Vegetables Fresh or frozen vegetables (raw, steamed, roasted, or grilled). Low-sodium or reduced-sodium tomato and vegetable juice. Low-sodium or reduced-sodium tomato sauce and tomato paste. Low-sodium or reduced-sodium canned vegetables. Fruits All fresh, dried, or frozen fruit. Canned fruit in natural juice (without added sugar). Meat and other protein foods Skinless chicken or [place]. Ground chicken or [place]. Pork with fat trimmed off. Fish and seafood. Egg whites. Dried beans, peas, or lentils. Unsalted nuts, nut butters, and seeds. Unsalted canned beans. Lean cuts of beef with fat trimmed off. Low-sodium, lean deli meat. Dairy Low-fat (1%) or fat-free (skim) milk. Fat-free, low-fat, or reduced-fat cheeses. Nonfat, low-sodium ricotta or cottage cheese. Low-fat or nonfat yogurt. Low-fat, low-sodium cheese. Fats and oils Soft margarine without trans fats. Vegetable oil. Low-fat, reduced-fat, or light mayonnaise and salad dressings (reduced-sodium). Canola, safflower, olive, soybean, and sunflower oils. Avocado. Seasoning and other foods Herbs. Spices. Seasoning mixes without salt. Unsalted popcorn and pretzels. Fat-free sweets. What foods are not recommended? The items listed may not be a complete list. Talk with your dietitian about what dietary choices are best for you. Grains Baked goods made with fat, such as croissants, muffins, or some breads. Dry pasta or rice meal packs. Vegetables Creamed or fried vegetables. Vegetables in a cheese sauce. Regular canned vegetables (not low-sodium or reduced-sodium). Regular canned tomato sauce and paste (not low-sodium or reduced-sodium). Regular  tomato and vegetable juice (not low-sodium or reduced-sodium). Angie Fava. Olives. Fruits Canned fruit in a light or heavy syrup. Fried fruit. Fruit in cream or butter sauce. Meat and other protein foods Fatty cuts of meat. Ribs. Fried meat. Berniece Salines. Sausage. Bologna and other processed lunch meats. Salami. Fatback. Hotdogs. Bratwurst. Salted nuts and seeds. Canned beans with added salt. Canned or smoked fish. Whole eggs or egg yolks. Chicken or [place] with skin. Dairy Whole or 2% milk, cream, and half-and-half. Whole or full-fat cream cheese. Whole-fat or sweetened yogurt. Full-fat cheese. Nondairy creamers. Whipped toppings. Processed cheese and cheese spreads. Fats and oils Butter. Stick margarine. Lard. Shortening. Ghee. Bacon fat. Tropical oils, such as coconut, palm kernel, or palm oil. Seasoning and other foods Salted popcorn and pretzels. Onion salt, garlic salt, seasoned salt, table salt, and sea salt. Worcestershire sauce. Tartar sauce. Barbecue sauce. Teriyaki sauce. Soy sauce, including reduced-sodium. Steak sauce. Canned and packaged gravies. Fish sauce. Oyster sauce. Cocktail sauce. Horseradish that you find on the shelf. Ketchup. Mustard. Meat flavorings and tenderizers. Bouillon cubes. Hot sauce and Tabasco sauce. Premade or packaged marinades. Premade or packaged taco seasonings. Relishes. Regular salad dressings. Where to find more information:  National Heart, Lung, and Blood  Institute: https://wilson-eaton.com/  American Heart Association: www.heart.org Summary  The DASH eating plan is a healthy eating plan that has been shown to reduce high blood pressure (hypertension). It may also reduce your risk for type 2 diabetes, heart disease, and stroke.  With the DASH eating plan, you should limit salt (sodium) intake to 2,300 mg a day. If you have hypertension, you may need to reduce your sodium intake to 1,500 mg a day.  When on the DASH eating plan, aim to eat more fresh fruits and  vegetables, whole grains, lean proteins, low-fat dairy, and heart-healthy fats.  Work with your health care provider or diet and nutrition specialist (dietitian) to adjust your eating plan to your individual calorie needs. This information is not intended to replace advice given to you by your health care provider. Make sure you discuss any questions you have with your health care provider. Document Released: 12/02/2011 Document Revised: 11/25/2017 Document Reviewed: 12/06/2016 Elsevier Patient Education  2020 [employer].

## 2021-03-02 ENCOUNTER — Telehealth: Payer: Self-pay

## 2021-03-02 NOTE — Telephone Encounter (Signed)
LVMT requesting call back to discuss referral to [facility] 

## 2021-03-09 ENCOUNTER — Telehealth (HOSPITAL_COMMUNITY): Payer: Self-pay

## 2021-03-09 DIAGNOSIS — Z0000 Encounter for general adult medical examination without abnormal findings: Secondary | ICD-10-CM

## 2021-03-09 NOTE — Telephone Encounter (Signed)
Tried to connect with patient via telephone for health coaching session. However, was not able to connect, so left a message for patient to call back. Larose Kells., MPH Candidate H&V Health Coach Intern

## 2021-03-09 NOTE — Telephone Encounter (Signed)
Patient was called by MPH Candidate for Health Coaching on 03/09/21 and left a message for patient to return call at [phone].   Avelino Leeds, MS, [place], [facility] Care Guide 03/09/2021 3:37 PM

## 2021-03-31 ENCOUNTER — Ambulatory Visit (INDEPENDENT_AMBULATORY_CARE_PROVIDER_SITE_OTHER): Payer: Medicare HMO | Admitting: Pharmacist

## 2021-03-31 ENCOUNTER — Other Ambulatory Visit: Payer: Self-pay

## 2021-03-31 VITALS — BP 132/84 | HR 93 | Resp 17 | Ht 61.0 in | Wt 203.4 lb

## 2021-03-31 DIAGNOSIS — I10 Essential (primary) hypertension: Secondary | ICD-10-CM | POA: Diagnosis not present

## 2021-03-31 MED ORDER — CHLORTHALIDONE 25 MG PO TABS: 25.0000 mg | ORAL_TABLET | Freq: Every day | ORAL | 1 refills | Status: DC

## 2021-03-31 NOTE — Progress Notes (Signed)
Patient ID: Kristen Shelton                 DOB: December 31, 1955                      MRN: [id number]     HPI: Kristen Shelton is a 65 y.o. female referred by Kristen Shelton to pharmacist HTN clinic as part of adv HTN management. PMH includes hyperlipidemia, hypertension, CKD-II, and tobacco abuse (chewing tobacco). Patient presents for 1st follow up and reports "feeling well". Denies recent HA, dizziness, chest pain, swelling or increased fatigue, but still having issues with sleep and will like a recommendation for OTC product for sleep aid. Noted not participating on PRED yet, unsuccessful contact attempt with Kristen Shelton, and still waiting to received nicotine patches.    Current HTN meds:  Amlodipine 10mg  daily (increased during last OV with Dr Kristen Shelton) HCTZ 25mg  daily Lisinopril 40mg  daily  Previously tried:   BP goal:  149ish prior to 1st visit,  Family History: The patient's family history includes Bone cancer in her brother; Heart attack in her mother; Hypertension in her sister; Stroke in her sister. There is no history of Colon cancer, Colon polyps, Esophageal cancer, Rectal cancer, or Stomach cancer.  Social History: chewing tobacco (patine limiting amount), denies alcohol use  Diet: continues to work on low sodium options  Exercise: activities of daily living  Home BP readings: 10 morning readings, average 136/76, HR range 59-82bpm) 10 evening readings, average 133/80, HR range 73-87bpm)  Wt Readings from Last 3 Encounters:  03/31/21 203 lb 6.4 oz (92.3 kg)  02/27/21 201 lb (91.2 kg)  01/15/21 200 lb (90.7 kg)   BP Readings from Last 3 Encounters:  03/31/21 132/84  02/27/21 132/86  01/15/21 129/68   Pulse Readings from Last 3 Encounters:  03/31/21 93  02/27/21 73  01/15/21 61    Past Medical History:  Diagnosis Date  . Hypertension   . Osteopenia determined by x-ray 02/15/2021  . Proteinuria 10/29/2020    Current Outpatient Medications on File Prior to Visit   Medication Sig Dispense Refill  . amLODipine (NORVASC) 10 MG tablet Take 1 tablet (10 mg total) by mouth daily. 90 tablet 3  . Calcium Carb-Cholecalciferol (CALCIUM 1000 + D PO) Take by mouth.    Marland Kitchen lisinopril (ZESTRIL) 40 MG tablet Take 1 tablet (40 mg total) by mouth daily. 90 tablet 0  . VITAMIN D PO Take by mouth.    . nicotine (NICODERM CQ) 14 mg/24hr patch Place 1 patch (14 mg total) onto the skin daily. FOR 2 WEEKS AFTER YOU FINISH THE 21 MG PATCH (Patient not taking: Reported on 03/31/2021) 14 patch 0  . nicotine (NICODERM CQ) 21 mg/24hr patch Place 1 patch (21 mg total) onto the skin daily. FOR 6 WEEKS (Patient not taking: Reported on 03/31/2021) 42 patch 0  . nicotine (NICODERM CQ) 7 mg/24hr patch Place 1 patch (7 mg total) onto the skin daily. FOR 2 WEEKS AFTER YOU FINISH THE 14 MG RX (Patient not taking: Reported on 03/31/2021) 14 patch 0   No current facility-administered medications on file prior to visit.    No Known Allergies  Blood pressure 132/84, pulse 93, resp. rate 17, height 5\' 1"  (1.549 m), weight 203 lb 6.4 oz (92.3 kg), last menstrual period 07/13/2020, SpO2 99 %.  HYPERTENSION, BENIGN ESSENTIAL Blood pressure remains slightly above goal, but stable in the 956L systolic and low 80 diastolic. Will change HCTZ for  chlorthalidone 25mg  daily, repeat BMET in 10-14 days, and follow up in 4 weeks.  Patient encouraged to continue positive lifestyle modifications. She is already working on less chewing tobacco. Phone number for care navigator (Kristen Shelton) provided as well.    Kristen Shelton PharmD, BCPS, CPP Kristen Shelton [id number] 04/06/2021 11:35 AM

## 2021-03-31 NOTE — Progress Notes (Signed)
Met with patient after her visit with pharmacy today to inquiry about her interest in health coaching for smoking cessation per Dr. Oval Linsey. Patient stated that in the past two weeks she has not used snuff and has been chewing gum instead. Patient shared that she has had trouble getting her nicotine patches because her insurance does not cover it. I provided her with information on [id number] to inquiry about the program/patches.  Patient is interested in health coaching for diet and moving more to lower her blood pressure. Patient has been scheduled for her initial health coaching session on April 02, 2021 at 1:30pm to continue the assessment and set up initial steps to improve healthy eating and physical activity behaviors. She is aware that action steps can be created to aid in tobacco cessation in addition to the nicotine patches if need be.

## 2021-03-31 NOTE — Patient Instructions (Addendum)
Return for a  follow up appointment in 4-5 weeks  Check your blood pressure at home daily (if able) and keep record of the readings.  Blood work: 10-14 days  Take your BP meds as follows: *STOP taking HCTZ 25mg * *START Chlorthalidone 25mg  daily*  * START taking melatonin 3mg  daily in the evening*  Amy Nuccio for [employer] at [phone]  Bring all of your meds, your BP cuff and your record of home blood pressures to your next appointment.  Exercise as you're able, try to walk approximately 30 minutes per day.  Keep salt intake to a minimum, especially watch canned and prepared boxed foods.  Eat more fresh fruits and vegetables and fewer canned items.  Avoid eating in fast food restaurants.    HOW TO TAKE YOUR BLOOD PRESSURE: . Rest 5 minutes before taking your blood pressure. .  Don't smoke or drink caffeinated beverages for at least 30 minutes before. . Take your blood pressure before (not after) you eat. . Sit comfortably with your back supported and both feet on the floor (don't cross your legs). . Elevate your arm to heart level on a table or a desk. . Use the proper sized cuff. It should fit smoothly and snugly around your bare upper arm. There should be enough room to slip a fingertip under the cuff. The bottom edge of the cuff should be 1 inch above the crease of the elbow. . Ideally, take 3 measurements at one sitting and record the average.

## 2021-04-02 ENCOUNTER — Ambulatory Visit (INDEPENDENT_AMBULATORY_CARE_PROVIDER_SITE_OTHER): Payer: Medicare HMO

## 2021-04-02 ENCOUNTER — Other Ambulatory Visit: Payer: Self-pay

## 2021-04-02 DIAGNOSIS — Z0000 Encounter for general adult medical examination without abnormal findings: Secondary | ICD-10-CM

## 2021-04-02 NOTE — Progress Notes (Signed)
Appointment Outcome:  Completed, Session #: Initial Health Coaching Session  AGREEMENTS SECTION   Overall Goal(s): Improving healthy eating behaviors Increasing physical activity                                           Agreement/Action Steps:  Step 1: Read food labels Step 2: Purchase low- or no-sodium products Step 3: Cook without adding salt Step 3: Bake/boil chicken Step 4: Walk in park @ 9am for 30 mins (3 days/wk)    Progress Notes:  Patient informed Care Guide that her and Dr. Oval Linsey discussed reducing sodium in her diet. Patient was provided information on the Dash diet during her visit and wants to incorporate some changes. Patient shared that she eats chicken at least 3x/wk, shrimp and fish, beef, but not pork. Patient shared that she was instructed not to eat fried foods and stated that won't be an issue because she can bake or boil her meats.   Patient shared that she likes being outdoors in her yard. Patient mentioned that she is typically not stressed, and if something does seem to bother her, she normally goes for a ride to clear her mind.   Patient stated that has been cooking with Mrs. Dash and garlic powder, and no salt added to food during its preparation. Patient stated that she tries to find food items with 50 mg of sodium or less per serving.   Patient stated that she would like to start walking in the nearby park in the mornings to help her increase her physical activity.  Patient is highly motivated to implement these steps to improve her overall health and to ensure that she has an opportunity to see her great-grandson. Patient shared that her daughter will be supporting her with these behavior changes.    Coaching Outcomes: Based on the patient's action step ideas, the following agreement has been established.  Agreement/Action Steps:  Step 1: Read food labels Step 2: Purchase low- or no-sodium products Step 3: Cook without adding salt Step 3:  Bake/boil chicken Step 4: Walk in park @ 9am for 30 mins (3 days/wk)  Patient stated that she will start walking tomorrow 04/03/21 and may be interested in adding an evening walk but will update on decision in two weeks. Patient will begin implementing action steps as outlined above over the next two weeks.   Patient has been mailed the health coaching agreement to sign and return to Care Guide. Care Guide signed health coaching agreement and Code of Ethics provided a copy for patient's records.

## 2021-04-06 ENCOUNTER — Encounter: Payer: Self-pay | Admitting: Pharmacist

## 2021-04-06 NOTE — Assessment & Plan Note (Signed)
Blood pressure remains slightly above goal, but stable in the 270W systolic and low 80 diastolic. Will change HCTZ for chlorthalidone 25mg  daily, repeat BMET in 10-14 days, and follow up in 4 weeks.  Patient encouraged to continue positive lifestyle modifications. She is already working on less chewing tobacco. Phone number for care navigator (Amy Spinney) provided as well.

## 2021-04-13 ENCOUNTER — Telehealth: Payer: Self-pay

## 2021-04-13 NOTE — Telephone Encounter (Signed)
LM FOR LABS

## 2021-04-15 ENCOUNTER — Telehealth: Payer: Self-pay

## 2021-04-15 DIAGNOSIS — Z0000 Encounter for general adult medical examination without abnormal findings: Secondary | ICD-10-CM

## 2021-04-15 NOTE — Telephone Encounter (Signed)
Called patient back to inform her that the pharmacy has orders for labs for her upcoming appointment with them on 04/28/21. Patient was made aware that after the health coaching session, she will be taken to the lab for blood work. Patient understood and confirmed that she will attend her appointment as scheduled.

## 2021-04-15 NOTE — Telephone Encounter (Signed)
Patient called in to inquire if she had a pharmacy appointment the same day as our health coaching session on 04/16/21. Patient thought that had moved her pharmacy appointment from 04/28/21. Care Guide informed patient that she will check with pharmacy about the notes regarding labs and give her a call back. Patient understood and agreed.

## 2021-04-16 ENCOUNTER — Other Ambulatory Visit: Payer: Self-pay

## 2021-04-16 ENCOUNTER — Ambulatory Visit (INDEPENDENT_AMBULATORY_CARE_PROVIDER_SITE_OTHER): Payer: Medicare HMO

## 2021-04-16 DIAGNOSIS — I10 Essential (primary) hypertension: Secondary | ICD-10-CM | POA: Diagnosis not present

## 2021-04-16 DIAGNOSIS — Z0000 Encounter for general adult medical examination without abnormal findings: Secondary | ICD-10-CM

## 2021-04-16 LAB — BASIC METABOLIC PANEL
BUN/Creatinine Ratio: 15 (ref 12–28)
BUN: 23 mg/dL (ref 8–27)
CO2: 24 mmol/L (ref 20–29)
Calcium: 9.6 mg/dL (ref 8.7–10.3)
Chloride: 103 mmol/L (ref 96–106)
Creatinine, Ser: 1.49 mg/dL — ABNORMAL HIGH (ref 0.57–1.00)
Glucose: 108 mg/dL — ABNORMAL HIGH (ref 65–99)
Potassium: 4.1 mmol/L (ref 3.5–5.2)
Sodium: 141 mmol/L (ref 134–144)
eGFR: 39 mL/min/{1.73_m2} — ABNORMAL LOW (ref >59)

## 2021-04-16 NOTE — Progress Notes (Signed)
Completed, session #: 1  AGREEMENTS SECTION  Overall Goal(s): Improving healthy eating behaviors Increasing physical activity      Agreement/Action Steps:  Step 1: Read food labels Step 2: Purchase low- or no sodium added products Step 3: Cook without adding salt Step 3: Bake/boil chicken Step 4: Walk in park @ 9am for 30 mins (3 days/wk)   Progress Notes:  Patient stated that reading food labels has been surprising. She has learned a lot about sodium content in foods. Patient didn't realize that the salt-free seasoning that she was using had over 700 mg of sodium. Now she does not use the seasoning. Patient mentioned that she only cooks with Mrs. Dash (chicken, vegetable, and regular), garlic and onion powder, and Italian seasoning.   Patient stated that it is challenging to find some products she like with 50 mg of sodium or less (this is the number of mg of sodium she is aiming for in canned goods.) Patient stated that she hasn't had a problem with purchasing no salt added or low-sodium products. Patient has been purchasing fresh produce instead of canned vegetables.   Patient has not cooked with salt since her visit with Vickie Henson, NP-C. Patient stated that cooking with no salt has not been a real challenge. Patient stated that she has not fried any food. Patient was tempted to eat fried chicken when her daughter bought Popeyes over. Patient refused the offer and utilized positive-self talk to encourage herself why she shouldn't eat it. Patient stated that she knows that the grease/oil will build up on the inside of her arteries and refused to eat it. Patient stated that she has been baking and boiling her chicken. She reported that she has been eating fish, shrimp, and beef, but no pork.   Patient has been walking inside of [employer] and walks another location twice located near [place]. Patient has been able to maintain walking 3x/week for 30 minutes over the last two weeks. Patient  thought it was going to be hard and stated that now she feels better and has more energy since she's being moving around more and eating better. Patient   Patient also shared that she has remained tobacco free still since we first met. Patient stated that she finally received her nicotine gum and mixes that with regular chewing gum.   . Indicators of Success and Accountability:  Patient stated that her indicator of success and accountability is being able to get up and walk as outlined.  . Readiness: Patient is in the action phase of improving healthy eating behaviors and increasing physical activity. . Strengths and Supports: Patient has her son as a support. Patient is motivated and goal oriented.  . Challenges and Barriers: Patient may face weather challenges with being able to walk in the park.   Coaching Outcomes: Patient stated that she wants to continue with the steps that are already in place. Patient was taken to the lab for blood work per PharmD request for her 04/28/21 appointment.   Attempted: . Fulfilled - Patient completed the weekly agreement in full and was able to meet the challenge.  

## 2021-04-23 ENCOUNTER — Telehealth: Payer: Self-pay | Admitting: *Deleted

## 2021-04-23 DIAGNOSIS — I10 Essential (primary) hypertension: Secondary | ICD-10-CM

## 2021-04-23 DIAGNOSIS — Z5181 Encounter for therapeutic drug level monitoring: Secondary | ICD-10-CM

## 2021-04-23 MED ORDER — CARVEDILOL 12.5 MG PO TABS: 12.5000 mg | ORAL_TABLET | Freq: Two times a day (BID) | ORAL | 3 refills | Status: DC

## 2021-04-23 NOTE — Telephone Encounter (Signed)
-----   Message from Skeet Latch, MD sent at 04/21/2021  4:15 PM EDT ----- Kidney function is going in the wrong direction.  Recommend reducing the chlorthalidone to 12.5mg .  We will need to add carvedilol 12.5mg   bid to help with the BP.  Keep tracking BP and heart rate.  BMP in 1 week.

## 2021-04-23 NOTE — Telephone Encounter (Signed)
Advised patient of lab results and medication changes, verbalized understanding  

## 2021-04-28 ENCOUNTER — Ambulatory Visit (INDEPENDENT_AMBULATORY_CARE_PROVIDER_SITE_OTHER): Payer: Medicare HMO

## 2021-04-28 ENCOUNTER — Encounter: Payer: Self-pay | Admitting: Pharmacist

## 2021-04-28 ENCOUNTER — Ambulatory Visit (INDEPENDENT_AMBULATORY_CARE_PROVIDER_SITE_OTHER): Payer: Medicare HMO | Admitting: Pharmacist

## 2021-04-28 ENCOUNTER — Other Ambulatory Visit: Payer: Self-pay

## 2021-04-28 VITALS — BP 152/88 | HR 55 | Resp 14 | Ht 61.0 in | Wt 207.2 lb

## 2021-04-28 DIAGNOSIS — I10 Essential (primary) hypertension: Secondary | ICD-10-CM | POA: Diagnosis not present

## 2021-04-28 DIAGNOSIS — Z0000 Encounter for general adult medical examination without abnormal findings: Secondary | ICD-10-CM

## 2021-04-28 NOTE — Progress Notes (Signed)
Appointment Outcome:  Completed, Session #: 3 Start time: 9:06am   End time: 9:35am   Total Mins: 29 minutes   AGREEMENTS SECTION   Overall Goal(s): Improving healthy eating behaviors Increasing physical activity      Agreement/Action Steps:  Step 1: Read food labels Step 2: Purchase low- or no sodium added products Step 3: Cook without adding salt Step 3: Bake/boil chicken Step 4: Walk in park @ 9am for 30 mins (3 days/wk)   Progress Notes:  Patient stated that she has been reading food labels on the items that she's already purchased. Patient has not been grocery shopping and normally will purchase things in bulk to last 2-3 weeks. Patient has low-sodium and no salt added foods available at home. Patient shared that she would start reading food labels on her seasoning because she used lemon pepper to season her baked chicken. Patient stated that it tasted salty but didn't check the food label. Patient stated that she still cooks with no salt and uses garlic and onion powders and [place] seasoning.  Patient has been practicing portion control and not eating everything off her plate. Patient stated that she realized that when reading food labels that the amount of sodium is per serving and that she must watch how many servings of various foods, so she doesn't consume too much sodium. Patient stated that she has cut back on carbs (don't eat rice) and is eating more protein. Patient reported that she has only eaten chicken, shrimp and fish, and beef. Patient shared that she only eats before once out the week and then leftovers. Patient stated that she cut back on bread and have sandwiches only once a week. Patient stated that she has switched to wheat bread. Patient stated that to watch out for her sodium consumption, she has been preparing food more at home. Patient stated that fast food was their thing because they were always on the go. Patient mentioned that now, she takes the time to eat  before going out. Patient stated that if she were to go out, she would even question how the vegetables have been prepared.  Patient walks in the park 3 days/week at various times for 30 minutes depending on the weather. Patient states that if it's raining, she walks around [place] several times. Patient stated that she walks for 15 minutes in one direction, take a rest, then walk back for 15 minutes. Patient stated that instead of sitting or lying down while at home has been replaced by more movement. Patient has spent a couple of hours cleaning her home recently as physical activity. Patient stated that she walks throughout the day inside and go outside in move around in the yard as well. Patient mentioned that she is also following along with a dance video during the week. Patient stated that she is feeling more energized, and her mood has lifted, and from not laying around like before and she is feeling good. Patient is hopeful that she has lost some weight. However, patient shared that her pants are fitting looser.   Patient shared her progress regarding tobacco cessation. Patient has received her nicotine gum from [employer]. Patient mentioned that she chews the nicotine gum and regular gum together. Patient stated that she really hasn't thought about tobacco since using the gum and her cravings have gone down. Patient has shared the [employer] number with her daughter to support her in quitting. Patient mentioned that as a hobby and a way to give back, her  and her daughter feed the homeless after church on Sundays. Patient shared that her sleep has improved, and she is able to sleep all night. Patient was taking a sleep aid but has not taken more than two pills in the past two weeks.  . Indicators of Success and Accountability:  Patient stated that being consistent with walking and eating habits are her indicators of success and accountability. . Readiness: Patient is in the action phase of improving  healthy eating behaviors and increasing physical activity. . Strengths and Supports: Patient stated that she has willpower and has been persistent and mindful. . Challenges and Barriers: Patient was challenged by the weather while trying to get in her walking as outlined.   Coaching Outcomes: Patient increased her walking to 4 days/week for 30 minutes.   Although they are not her identified steps for these goals, the patient will continue to practice portion control, working out to dance video, using nicotine gum, and engaging in hobby/charity.   Patient will start reading food labels on everything she purchases.   Patient's takeaway from today's session is that before knowing about her blood pressure, she was eating everything she liked. Patient stated, "If I think I can do it, I can do it!"   Attempted: Marland Kitchen Fulfilled - Patient completed the weekly agreement in full and was able to meet the challenge.

## 2021-04-28 NOTE — Progress Notes (Signed)
Patient ID: Kristen Shelton                 DOB: [birth year]                      MRN: [id number]     HPI: Kristen Shelton is a 65 y.o. female referred by Dr. Oval Linsey to pharmacist HTN clinic as part of adv HTN management. PMH includes hyperlipidemia, hypertension, CKD-II, and tobacco abuse (chewing tobacco). Denies recent HA, dizziness, chest pain, swelling or increased fatigue.  Noted repeat BMET showed elevated Scr and medication was changed to chlorthalidone 12.5mg  daily. Carvedilol 12.5mg  BID was added to her therapy on 04/23/21. Patient admits eating salty food yesterday and forgot to take her medication this morning  Current HTN meds:  Amlodipine 10mg  daily  Chlorthalidone 12.5mg  every day Lisinopril 40mg  daily Carvedilol 12.5mg  twice daily  BP goal: 130/80  Family History: The patient's family history includes Bone cancer in her brother; Heart attack in her mother; Hypertension in her sister; Stroke in her sister. There is no history of Colon cancer, Colon polyps, Esophageal cancer, Rectal cancer, or Stomach cancer.  Social History:  chewing tobacco (patine limiting amount), denies alcohol use   Diet: continues to work on low sodium options  Exercise: activities of daily living  Home BP readings: 21 morning BP readings, average 134/77, HR 59-85 21 evening BP readings, average 129/79, HR 69-87  Wt Readings from Last 3 Encounters:  04/28/21 207 lb 3.2 oz (94 kg)  03/31/21 203 lb 6.4 oz (92.3 kg)  02/27/21 201 lb (91.2 kg)   BP Readings from Last 3 Encounters:  04/28/21 (!) 152/88  03/31/21 132/84  02/27/21 132/86   Pulse Readings from Last 3 Encounters:  04/28/21 (!) 55  03/31/21 93  02/27/21 73    Past Medical History:  Diagnosis Date  . Hypertension   . Osteopenia determined by x-ray 02/15/2021  . Proteinuria 10/29/2020    Current Outpatient Medications on File Prior to Visit  Medication Sig Dispense Refill  . amLODipine (NORVASC) 10 MG tablet Take 1 tablet  (10 mg total) by mouth daily. 90 tablet 3  . Calcium Carb-Cholecalciferol (CALCIUM 1000 + D PO) Take by mouth.    . carvedilol (COREG) 12.5 MG tablet Take 1 tablet (12.5 mg total) by mouth 2 (two) times daily. 180 tablet 3  . chlorthalidone (HYGROTON) 25 MG tablet Take 25 mg by mouth as directed. TAKE TABLET 1/2 DAILY    . lisinopril (ZESTRIL) 40 MG tablet Take 1 tablet (40 mg total) by mouth daily. 90 tablet 0  . nicotine polacrilex (NICORETTE) 4 MG gum Take 4 mg by mouth as needed for smoking cessation.    Marland Kitchen VITAMIN D PO Take by mouth.     No current facility-administered medications on file prior to visit.    No Known Allergies  Blood pressure (!) 152/88, pulse (!) 55, resp. rate 14, height 5\' 1"  (1.549 m), weight 207 lb 3.2 oz (94 kg), last menstrual period 07/13/2020, SpO2 96 %.  Uncontrolled hypertension Blood pressure remains above goal and patient admits dietary indiscretions. She usually takes all the medication in the morning, but forgot to take it this morning prior to follow up visit. Noted home BP average at 134/77 for morning remains and 129/79 for evening readings.  Will change amlodipine to evening administration and separate carvedilol doses by 12 hours. Patient is due to repeat BMET in 1 week and follow up with HTN clinic  in 4 weeks.   Kristen Shelton PharmD, BCPS, [facility] [address] [place] [address] 04/30/2021 5:00 PM

## 2021-04-28 NOTE — Patient Instructions (Addendum)
Return for a  follow up appointment in 4 weeks  Go to the lab in 1 week  Check your blood pressure at home daily (if able) and keep record of the readings.  Take your BP meds as follows:  Morning medication: Carvedilol 12.5mg  Chlorthalidone 12.5mg  (1/2 tablet of 25mg ) Lisinopril 40mg    Evening medication: Amlodipine 10mg  daily  Carvedilol 12.5mg   Bring all of your meds, your BP cuff and your record of home blood pressures to your next appointment.  Exercise as you're able, try to walk approximately 30 minutes per day.  Keep salt intake to a minimum, especially watch canned and prepared boxed foods.  Eat more fresh fruits and vegetables and fewer canned items.  Avoid eating in fast food restaurants.    HOW TO TAKE YOUR BLOOD PRESSURE: . Rest 5 minutes before taking your blood pressure. .  Don't smoke or drink caffeinated beverages for at least 30 minutes before. . Take your blood pressure before (not after) you eat. . Sit comfortably with your back supported and both feet on the floor (don't cross your legs). . Elevate your arm to heart level on a table or a desk. . Use the proper sized cuff. It should fit smoothly and snugly around your bare upper arm. There should be enough room to slip a fingertip under the cuff. The bottom edge of the cuff should be 1 inch above the crease of the elbow. . Ideally, take 3 measurements at one sitting and record the average.

## 2021-04-30 ENCOUNTER — Encounter: Payer: Self-pay | Admitting: Pharmacist

## 2021-04-30 NOTE — Assessment & Plan Note (Signed)
Blood pressure remains above goal and patient admits dietary indiscretions. She usually takes all the medication in the morning, but forgot to take it this morning prior to follow up visit. Noted home BP average at 134/77 for morning remains and 129/79 for evening readings.  Will change amlodipine to evening administration and separate carvedilol doses by 12 hours. Patient is due to repeat BMET in 1 week and follow up with HTN clinic in 4 weeks.

## 2021-05-11 ENCOUNTER — Ambulatory Visit (INDEPENDENT_AMBULATORY_CARE_PROVIDER_SITE_OTHER): Payer: Medicare HMO | Admitting: Pharmacist Clinician (PhC)/ Clinical Pharmacy Specialist

## 2021-05-11 ENCOUNTER — Other Ambulatory Visit: Payer: Self-pay

## 2021-05-11 DIAGNOSIS — Z5181 Encounter for therapeutic drug level monitoring: Secondary | ICD-10-CM | POA: Diagnosis not present

## 2021-05-11 DIAGNOSIS — I10 Essential (primary) hypertension: Secondary | ICD-10-CM

## 2021-05-11 LAB — BASIC METABOLIC PANEL
BUN/Creatinine Ratio: 11 — ABNORMAL LOW (ref 12–28)
BUN: 24 mg/dL (ref 8–27)
CO2: 24 mmol/L (ref 20–29)
Calcium: 9.4 mg/dL (ref 8.7–10.3)
Chloride: 102 mmol/L (ref 96–106)
Creatinine, Ser: 2.16 mg/dL — ABNORMAL HIGH (ref 0.57–1.00)
Glucose: 100 mg/dL — ABNORMAL HIGH (ref 65–99)
Potassium: 4.3 mmol/L (ref 3.5–5.2)
Sodium: 140 mmol/L (ref 134–144)
eGFR: 25 mL/min/{1.73_m2} — ABNORMAL LOW (ref >59)

## 2021-05-11 NOTE — Patient Instructions (Addendum)
Return for a a follow up appointment   Go to the lab today to check kidney function and electrolytes  Check your blood pressure at home daily (if able) and keep record of the readings.  Take your BP meds as follows:  Continue with all current medications  Bring all of your meds, your BP cuff and your record of home blood pressures to your next appointment.  Exercise as you're able, try to walk approximately 30 minutes per day.  Keep salt intake to a minimum, especially watch canned and prepared boxed foods.  Eat more fresh fruits and vegetables and fewer canned items.  Avoid eating in fast food restaurants.    HOW TO TAKE YOUR BLOOD PRESSURE: . Rest 5 minutes before taking your blood pressure. .  Don't smoke or drink caffeinated beverages for at least 30 minutes before. . Take your blood pressure before (not after) you eat. . Sit comfortably with your back supported and both feet on the floor (don't cross your legs). . Elevate your arm to heart level on a table or a desk. . Use the proper sized cuff. It should fit smoothly and snugly around your bare upper arm. There should be enough room to slip a fingertip under the cuff. The bottom edge of the cuff should be 1 inch above the crease of the elbow. . Ideally, take 3 measurements at one sitting and record the average.

## 2021-05-11 NOTE — Progress Notes (Signed)
Patient ID: Kristen Shelton                 DOB: 06/08/1956                      MRN: [id number]     HPI: Kristen Shelton is a 65 y.o. female referred by Dr. Oval Linsey to pharmacist HTN clinic as part of adv HTN management. PMH includes hyperlipidemia, hypertension, CKD-II, and tobacco abuse (chewing tobacco). Denies recent HA, dizziness, chest pain, swelling or increased fatigue.  Noted repeat BMET showed elevated Scr and medication was changed to chlorthalidone 12.5mg  daily. Carvedilol 12.5mg  BID was added to her therapy on 04/23/21. Patient admits eating salty food yesterday and forgot to take her medication this morning   At her last visit with Dr. Carrolyn Leigh her pressure was elevated to 152/88.  Patient admitted to missing her medications that morning as well as having a salty meal the day before.  Home readings at that time averaged 134/77 in the mornings and 129/79 in the evenings.  She was asked to move amlodipine to evening dosing.   Today she returns for follow up.    Current HTN meds:  Amlodipine 10mg  daily -pm Chlorthalidone 12.5mg  every day Lisinopril 40mg  daily Carvedilol 12.5mg  twice daily  BP goal: 130/80  Family History: The patient's family history includes Bone cancer in her brother; Heart attack in her mother; Hypertension in her sister; Stroke in her sister. There is no history of Colon cancer, Colon polyps, Esophageal cancer, Rectal cancer, or Stomach cancer.  Social History:  chewing tobacco (patine limiting amount), denies alcohol use; no coffee or regular caffeine use   Diet: continues to work on low sodium options - chicken (boided or baked), baked fish;  Breakfast mostly [employer] and fruit; snacks on unsalted chips, apples, raisins  Exercise: activities of daily living; walks about 30 minutes per day, if raining will walk in [place] BP readings: 21 morning BP readings, average 134/77, HR 59-85 21 evening BP readings, average 129/79, HR 69-87  Wt  Readings from Last 3 Encounters:  05/11/21 206 lb 9.6 oz (93.7 kg)  04/28/21 207 lb 3.2 oz (94 kg)  03/31/21 203 lb 6.4 oz (92.3 kg)   BP Readings from Last 3 Encounters:  05/11/21 128/72  04/28/21 (!) 152/88  03/31/21 132/84   Pulse Readings from Last 3 Encounters:  05/11/21 76  04/28/21 (!) 55  03/31/21 93    Past Medical History:  Diagnosis Date  . Hypertension   . Osteopenia determined by x-ray 02/15/2021  . Proteinuria 10/29/2020    Current Outpatient Medications on File Prior to Visit  Medication Sig Dispense Refill  . amLODipine (NORVASC) 10 MG tablet Take 1 tablet (10 mg total) by mouth daily. 90 tablet 3  . Calcium Carb-Cholecalciferol (CALCIUM 1000 + D PO) Take by mouth.    . carvedilol (COREG) 12.5 MG tablet Take 1 tablet (12.5 mg total) by mouth 2 (two) times daily. 180 tablet 3  . chlorthalidone (HYGROTON) 25 MG tablet Take 25 mg by mouth as directed. TAKE TABLET 1/2 DAILY    . lisinopril (ZESTRIL) 40 MG tablet Take 1 tablet (40 mg total) by mouth daily. 90 tablet 0  . nicotine polacrilex (NICORETTE) 4 MG gum Take 4 mg by mouth as needed for smoking cessation.    Marland Kitchen VITAMIN D PO Take by mouth.     No current facility-administered medications on file prior to visit.  No Known Allergies  Blood pressure 128/72, pulse 76, resp. rate 17, height 5\' 1"  (1.549 m), weight 206 lb 9.6 oz (93.7 kg), last menstrual period 07/13/2020, SpO2 97 %.  HYPERTENSION, BENIGN ESSENTIAL Patient with essential hypertension, now at goal.  Home readings trend into the 130's in the evenings, however morning average is 269 systolic.  Will have her continue with current regimen and repeat metabolic panel today.  She can follow up with Dr. Oval Linsey in 3 months.  If kidney function is stable, can consider increasing chlorthalidone to full 25 mg daily in the future should her pressures increase.    Tommy Medal PharmD CPP [facility] [address]  [place] [address] 05/11/2021 11:45 AM

## 2021-05-11 NOTE — Assessment & Plan Note (Signed)
Patient with essential hypertension, now at goal.  Home readings trend into the 130's in the evenings, however morning average is 176 systolic.  Will have her continue with current regimen and repeat metabolic panel today.  She can follow up with Dr. Oval Linsey in 3 months.  If kidney function is stable, can consider increasing chlorthalidone to full 25 mg daily in the future should her pressures increase.

## 2021-05-12 ENCOUNTER — Ambulatory Visit (INDEPENDENT_AMBULATORY_CARE_PROVIDER_SITE_OTHER): Payer: Medicare HMO

## 2021-05-12 DIAGNOSIS — Z0000 Encounter for general adult medical examination without abnormal findings: Secondary | ICD-10-CM

## 2021-05-12 NOTE — Progress Notes (Signed)
Appointment Outcome:  Completed, Session #: 4 Start time: 9:19am   End time: 9:50am   Total Mins: 31 minutes  AGREEMENTS SECTION  Overall Goal(s): Improving healthy eating behaviors Increasing physical activity      Agreement/Action Steps:  Step 1: Read food labels Step 2: Purchase low- or no sodium added products Step 3: Cook without adding salt Step 3: Bake/boil chicken Step 4: Walk in park @ 9am for 30 mins (4 days/wk)   Progress Notes:  Patient stated that she had a good report on her blood pressure improving.  Patient reported that she has been walking 4 days a week for 45 minutes (180 minutes of walking per week) with her daughter at the park/school. Patient shared that she has also been performing chair exercises/stretches for approximately 30 minutes each day. Patient mentioned that it is helping her with becoming more flexible. Patient stated that she has a stretch band that she will start using as well. Patient is also working in her yard throughout the week.   Patient stated that she is not adding salt to her food after cooking or while cooking. Patient has been more conscious of reading food labels on seasonings and having switched to Mrs. Dash in addition to using garlic and onion powder. Patient stated that she is still purchasing low- or no sodium added products. Patient shared that she can still taste salt, so she washes off her canned foods such as green beans and corn. Patient continues not to eat fried food, including chicken and fish. Patient stated that baking it gives her a little crunch and appears to be fried, which helps a lot. Patient mentioned that she thought it would be hard to give up certain foods (e.g., pork - ham and pig feet), but it has not been a problem. Patient stated that her grandson encouraged her to stop using hot sauce recently by pointing out on the label how much sodium it contains. Patient does not put hot sauce on her fish now, instead she  squeezes lemon juice on it after baking it.   Patient stated that reading food labels has helped her identify foods that are high in sodium, and she has removed them from her diet. Patient stated that she did eat some regular potato chips and thought she had raised her BP. Patient stated that she will not do that again because it scared her thinking about messing up the progress she has made. Patient stated that she normally purchases two bags of unsalted chips that last her a month. Patient was out of these and was tempted to eat her son's chips. Patient was asked how she could work around the craving the next time. Patient will try to talk herself out of eating foods that she believes will raise her BP. Patient will also think about what food/snack she can replace the chips with.  Patient also shared an update on her tobacco cessation. She continues to chew the nicotine gum along with her regular chewing gum to manage cravings. Patient thought that she would have had a hard time quitting but she feels that she has this habit beat.     Indicators of Success and Accountability:  Patient stated that walking as scheduled and quitting tobacco are her indicators of success and accountability. . Readiness: Patient is in the action phase of improving healthy eating behaviors and increasing physical activity. . Strengths and Supports: Patient is being supported by her family, in particular her daughter, son, and grandson. Patient  stated that her strength is her willpower "self-discipline". . Challenges and Barriers: Patient has not experienced any challenges in the past two weeks.   Coaching Outcomes: Patient will continue to implement action steps as outlined with the inclusion of chair exercises for 30 minutes daily and walking for 45 minutes instead of 30 minutes 4x/week.  Patient will also continue to work in her yard for extra exercise. Patient is working to maintain 150 minutes of physical activity a  day.   Patient stated that her willpower "self-discipline" is fueled by wanting to live for her grandchildren and her children. This encourages her to keep going and working towards her goals. Patient is aiming to work her way down to at least one BP medication over time. Patient also shared that she would like to lose an avg of 5 lbs. every two weeks.    Agreement/Action Steps:  Step 1: Read food labels Step 2: Purchase low- or no sodium added products Step 3: Cook without adding salt Step 3: Bake/boil chicken Step 4: Walk in park for 45 mins (4 days/wk) Step 5: Perform chair exercises 30 mins/day  Attempted: Marland Kitchen Fulfilled - Patient has completed the weekly agreement in full and was able to meet the challenge.

## 2021-05-13 ENCOUNTER — Telehealth: Payer: Self-pay | Admitting: *Deleted

## 2021-05-13 DIAGNOSIS — I10 Essential (primary) hypertension: Secondary | ICD-10-CM

## 2021-05-13 DIAGNOSIS — Z5181 Encounter for therapeutic drug level monitoring: Secondary | ICD-10-CM

## 2021-05-13 MED ORDER — HYDRALAZINE HCL 50 MG PO TABS: 50.0000 mg | ORAL_TABLET | Freq: Two times a day (BID) | ORAL | 1 refills | Status: DC

## 2021-05-13 NOTE — Telephone Encounter (Signed)
Advised patient, verbalized understanding Scheduled follow up 1 month

## 2021-05-13 NOTE — Telephone Encounter (Signed)
-----   Message from Skeet Latch, MD sent at 05/12/2021 12:08 PM EDT ----- Kidney function is getting worse.  Recommend stopping chlorthalidone and reducing lisinopril to 20mg .  Start hydralazine 50mg  bid.  Repeat BMP in a week.  PharmD, Denyse Amass or me in HTN clinic in 1 month.

## 2021-05-21 ENCOUNTER — Ambulatory Visit: Payer: Medicare HMO

## 2021-06-02 ENCOUNTER — Ambulatory Visit (INDEPENDENT_AMBULATORY_CARE_PROVIDER_SITE_OTHER): Payer: Medicare HMO

## 2021-06-02 ENCOUNTER — Other Ambulatory Visit: Payer: Self-pay

## 2021-06-02 ENCOUNTER — Ambulatory Visit: Payer: Medicare HMO

## 2021-06-02 DIAGNOSIS — Z0000 Encounter for general adult medical examination without abnormal findings: Secondary | ICD-10-CM

## 2021-06-02 NOTE — Progress Notes (Signed)
Appointment Outcome:  Completed, Session #: 5 Start time: 9:28am   End time: 9:58am   Total Mins: 30 minutes  AGREEMENTS SECTION   Overall Goal(s): Improving healthy eating behaviors Increasing physical activity   Agreement/Action Steps:  Step 1: Read food labels Step 2: Purchase low- or no sodium added products Step 3: Cook without adding salt Step 3: Bake/boil chicken Step 4: Walk in park for 45 mins (4 days/wk) Step 5: Perform chair exercises 30 mins/day   Progress Notes:  Patient stated that she cooks her own food without salt and tells everyone else that they must put salt on their food. Patient shared that she was given a fried chicken wing by her son, but she could not eat it because it tastes bad, so she spit it out. Patient stated that she continues to bake/boil her chicken and other meats.   Patient stated that she continues to read food labels when buying groceries, especially on her can goods so she can ensure that she is picking up low- or no sodium products. Patient stated that this has become a routine. Patient stated that while she was out of town each weekend, she resisted eating foods that were unhealthy (e.g., banana pudding)).   Patient stated that she walked 3 days each week in the park for 45 minutes with her daughter each time. Patient stated that she did this because she had to walk a lot over each Saturday when she went out of town to [place] and [place]. Patient shared that she walked the beach for more than 45 minutes from one end to the other. She mentioned that she was tired but felt good afterwards. Patient stated that she walked for hours in [place] and was very tired. Patient has been engaging in at least 180 minutes of exercise by walking.   Patient was not able to perform chair exercises every day because she went out of town and was tired over the weekends. However, she was able to perform her chair exercises while she is watching tv/movie for 30  minutes. Patient stated that for a long time couldn't been over to tie her shoes. She can do it now with no trouble and she is not out of breath like before.    Indicators of Success and Accountability:  Patient stated that maintaining her walking and resisting certain foods when out of town is an indicator of success and accountability.  . Readiness: The patient is in the action phase of improving healthy eating and increasing physical activity. . Strengths and Supports: Patient stated that her ability to resist, remaining motivated and determined were her strengths. Patient is being supported by her grandchildren and her daughter.  . Challenges and Barriers: Patient does not foresee any challenges moving forward.   Coaching Outcomes: Patient will continue to implement action steps as outlined above. Patient is interested in doing leg lifts with weight machine and using stretch bands for toning arms twice a week. See new agreement below.  Agreement/Action Steps:  Step 1: Read food labels Step 2: Purchase low- or no sodium added products Step 3: Cook without adding salt Step 3: Bake/boil chicken Step 4: Walk in park for 45 mins (4 days/wk) Step 5: Perform chair exercises 30 mins/day Step 6: Perform leg lifts with weight machine and using stretch bands for  toning arms (2 days/wk)   Patient stated that she didn't think that she would be able to go without chewing tobacco. Now she is going to  cut back on the nicotine gum so that she does not get addicted to having something in her mouth again.   Patient has learned that she has more control and capabilities over her health behaviors at this age than she thought she could. Patient stated that she will keep telling herself that she must keep pushing to achieve her goals. She stated that she is proud of herself.   Attempted: Marland Kitchen Fulfilled - Patient is reading food labels, purchasing low- or no sodium added products, cook without adding salt,  baking/boil chicken, and walked 4x/week. . Partial - Patient did not perform chair exercises while out of town on the weekends.
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
## 2021-06-08 ENCOUNTER — Ambulatory Visit: Payer: Medicare HMO | Admitting: Cardiovascular Disease

## 2021-06-16 ENCOUNTER — Other Ambulatory Visit: Payer: Self-pay

## 2021-06-16 ENCOUNTER — Ambulatory Visit (INDEPENDENT_AMBULATORY_CARE_PROVIDER_SITE_OTHER): Payer: Medicare HMO

## 2021-06-16 DIAGNOSIS — Z0000 Encounter for general adult medical examination without abnormal findings: Secondary | ICD-10-CM

## 2021-06-16 NOTE — Progress Notes (Signed)
Completed, Session #: 6 Start time: 9:28am   End time: 9:56am   Total Mins: 28 minutes  AGREEMENTS SECTION    Overall Goal(s): Improving healthy eating behaviors Increasing physical activity   Agreement/Action Steps:  Step 1: Read food labels Step 2: Purchase low- or no sodium added products Step 3: Cook without adding salt Step 4: Bake/boil chicken Step 5: Walk in park for 45 mins (4 days/wk) Step 6: Perform chair exercises 30 mins/day Step 7: Perform leg lifts with weight machine and using stretch bands for toning arms (2 days/wk)  Progress Notes:  Patient stated that she has been walking in the park in the evenings 3 days/week for 45 minutes. Patient shared that she must work her way back to 4 days/week since she came back from her trip to [place]. Patient stated that she has also had sore muscles. Patient continues to perform her chair exercises during two episodes of Charm for 1-hour per day. Patient stated that she is using her son's Bow Flex twice a week for 5-10 minutes with the stretch bands and weighted leg lifts.   Patient stated that she also goes to her daughter's house twice a week to use her "stepping" machine for about 15 minutes. Patient reported that she also works in her backyard in the morning 3 days/week for about 30 minutes to either mow the lawn or tend to her flowers. Patient is currently engaging in a minimum of 325 minutes of physical activity per week.   Patient continues to read food labels to ensure that she is purchasing low- or no sodium added products. Patient stated that she has not had a challenge with cooking without salt. Patient stated that she has not had any fried food in the past month. Patient expressed that she loves her baked fish and have been barbecuing her chicken with [employer] barbecue sauce. Patient continues to use Mrs. Dash as a seasoning. Patient stated that she will be reading food labels when she goes shopping for July 4th today after  the session so ensure that she is sticking to her steps.   Patient also shared that she has been chewing only two pieces of nicotine gum a day (1 in am and 1 in evening after eating). Patient stated that she chews regular mint gum during the day.   Indicators of Success and Accountability:  Patient stated that being more consistent with her exercise is her indicator of success and accountability.  Readiness: Patient is in the action phase of improving healthy eating behaviors and increasing physical activity.  Strengths and Supports: Patient is being supported by her son, daughter, and grandchildren. Patient stated that her being consistent and having will power are her strengths.  Challenges and Barriers: Patient does not foresee any challenges with maintaining her steps over the next month.   Coaching Outcomes: Patient plans to cook her own foods during the July 4th celebration so she can avoid eating friend or salty foods. Patient stated that she has the will power to cook the food for others but not to eat it herself. Patient will be using Mrs. Dash as a seasoning for her foods.   Patient stated that she will continue to work her way back to walking 4 days per week, although her current exercise routine meets 150 minutes of weekly exercise that she will aim to maintain over the next month. Patient will continue to implement steps as agreed upon over the next month.  Patient stated that she  has learned in the past 3 months that she can put her mind to working towards a goal, she can accomplish anything that comes her way. Patient expressed that she didn't think that she could quit chewing tobacco but has done so for the last 3 months with the aid of nicotine gum and regular chewing gum. Patient plans to continue cutting back on nicotine gum until she no longer needs it.  Patient shared that in order to remain motivated, she will continue to remember her "why" for working towards these goals, and  that is "I have to do this for my grandkids!" Patient stated that she is bound to do better when she is focused on this reason.   Patient is going to challenge herself to reaching 165 pounds over the next few months. Patient will continue to track her weight loss at home.  Patient has completed her 6 sessions of the health coaching agreement and will participate in follow ups to ensure maintenance of her steps/goals.  Attempted: Fulfilled - Patient continues to meet the challenge of steps 1, 2, 3, 4, 6, and 7 as agreed upon. Partial - Patient walked 3x each week in the park for 45 minutes.

## 2021-06-22 ENCOUNTER — Other Ambulatory Visit: Payer: Self-pay | Admitting: Family Medicine

## 2021-07-14 ENCOUNTER — Ambulatory Visit (INDEPENDENT_AMBULATORY_CARE_PROVIDER_SITE_OTHER): Payer: Medicare HMO

## 2021-07-14 ENCOUNTER — Other Ambulatory Visit: Payer: Self-pay

## 2021-07-14 DIAGNOSIS — Z0000 Encounter for general adult medical examination without abnormal findings: Secondary | ICD-10-CM

## 2021-07-14 NOTE — Progress Notes (Signed)
Appointment Outcome:  Completed, Session #: 1 month f/u Start time: 8:52am   End time: 9:23am  Total Mins: 31 minutes  AGREEMENTS SECTION   Overall Goal(s): Improving healthy eating behaviors Increasing physical activity   Agreement/Action Steps:  Step 1: Read food labels Step 2: Purchase low- or no sodium added products Step 3: Cook without adding salt Step 4: Bake/boil chicken Step 5: Walk in park for 45 mins (4 days/wk) Step 6: Perform chair exercises 30 mins/day Step 7: Perform leg lifts with weight machine and using stretch bands for toning arms (2 days/wk)  Progress Notes:  Patient stated that everything has been going well and has been able to keep up with her steps. Patient reported that she is walking in the afternoons with her daughter 4 days a week for 45 mins. Patient shared that she drinks 2 bottles of water during that time. Patient stated that she increased performing the legs lifts and using the stretch bands at her son's house 3 days a week instead of 2 days. Patient continues to perform chair exercises while watching tv in the morning for about 30-45 minutes each day.   Patient reported that she continues not to cook with salt. Patient is using Mrs. Dash chicken and vegetable seasonings instead. Patient stated that she has been eating more vegetables and fruits. She is currently eating meat 3 times a week. Patient shared that she is reading food labels to help monitor her sodium intake. Patient mentioned that she read food labels while in [place] looking for a salad dressing with the least amount of sodium in it and found a [facility] with the reduce sodium.   Patient mentioned that she continues to bake/boil her meats to get out most of the fat content. Patient shared that she was given an air fryer that she is using to cook, and she believes that it's a healthy way to cook your food. Patient stated that she has replaced drinking soda with juice and has been  drinking more water.   Patient stated that her aim is to lose 5 lbs. in one month. Patient also reported that she continues to chew gum instead of tobacco. Patient has started to reduce how often she chews the nicotine gum as well. Patient is using the gum in the morning when she wakes up, and after she eats. Patient shared that she told her daughter that she feels so much better now that she stopped chewing tobacco.    Indicators of Success and Accountability:  Patient stated that reading food labels, not using salt, baking/boiling meats, and walking/exercising are her indicators of success and accountability.  Readiness: Patient is in the action phase of increasing physical activity and improving healthy eating behaviors.  Strengths and Supports: Patient is being supported by her family. Patient strengths has been her faith, staying motivated, focused, disciplined, and increasing self-awareness.  Challenges and Barriers: Patient does not foresee any challenges over the next 3 months to implementing her action steps.   Coaching Outcomes: Patient stated that she feels good about her accomplishments. Patient expressed that she didn't know she had run her body down when she was working. Patient stated that she looked in the mirror and stated that this wasn't herself because of the lack of attention she had paid to herself when she was working.    Patient mentioned that she goes to church every Sunday now. 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 Patient mentioned that she must stay as healthy as possible to continue this work. Patient stated that she didn't believe that she could keep up with walking/exercising but realized that she needs to stay focused. Patient stated that she is being motivated by her great-grandchildren.   Overall, the patient stated that she has dedicated herself to improving her health mentally and physically to get her body  healthier by focusing on eating healthy and moving more. Patient has been inspired to go back to school to pursue her GED. Patient also plans to focus more on lowering her blood pressure and monitoring her sugar intake.    Over the next three months, the patient will implement the following action steps.   Agreement/Action Steps:  Step 1: Read food labels to monitor sodium and sugar intake Step 2: Purchase low- or no sodium added products Step 3: Cook without adding salt Step 4: Bake/boil chicken Step 5: Walk in park for 45 mins (4 days/wk) Step 6: Perform chair exercises 30-45 mins/day Step 7: Perform leg lifts with weight machine and using stretch bands for toning arms (3 days/wk) Step 8: Check blood pressure as instructed Step 9: Take blood pressure medications as prescribed  Attempted: Fulfilled - Patient completed the monthly agreement in full and was able to meet the challenge.     1 month follow up wrap up questions 1.  What specifically have you achieved during the health coaching period? (Review your goals, and successes)  Increased physical activity/being physically active daily. Improved health eating habits/cooking with no salt and monitoring sodium intake.  2.  What are you doing differently as a direct result of health coaching? (Think broadly)   Reading food labels to monitor sodium intake Exercising 30 minutes or more daily.  3.  What are your top 3 health goals as you move forward in life?   Continue to lower blood pressure  Monitor sugar intake as well as sodium  Lose 5 lbs. in one month.  4.  How do you plan on achieving these goals?   Take medications as prescribed and check BP as instructed Reading food labels for sugar content and not using salt to cook. Stress management by finding time to relax.  5.  What negative beliefs have you let go of? (e.g., about yourself/life/others) I NO LONGER BELIEVE:    Didn't believe I could keep walking/exercising  regularly.  6.  What new skills, knowledge, and behaviors have you learned or discovered that you can build upon?        I NOW BELIEVE:    Confidence, discipline, and planning/scheduling are skills that have been discovered, which will be pertinent to the goal the patient has to go back to school for her GED.  7.  What has been the best part of the health coaching for you?   Health coach was easy to talk to. Having someone to talk to to hold me accountable.   8.  What is something the health coach can do to improve health coaching sessions?  Patient stated that health coach did everything possible to make sessions helpful. No ideas for improvement.   9.  What is ONE piece of advice you would give yourself moving forward?   Think about what the health coach would say!

## 2021-08-18 ENCOUNTER — Other Ambulatory Visit: Payer: Self-pay

## 2021-08-18 ENCOUNTER — Ambulatory Visit (INDEPENDENT_AMBULATORY_CARE_PROVIDER_SITE_OTHER): Payer: Medicare HMO | Admitting: Cardiovascular Disease

## 2021-08-18 ENCOUNTER — Encounter (HOSPITAL_BASED_OUTPATIENT_CLINIC_OR_DEPARTMENT_OTHER): Payer: Self-pay | Admitting: Cardiovascular Disease

## 2021-08-18 VITALS — BP 110/70 | HR 63 | Ht 62.0 in | Wt 210.8 lb

## 2021-08-18 DIAGNOSIS — E669 Obesity, unspecified: Secondary | ICD-10-CM

## 2021-08-18 DIAGNOSIS — E7800 Pure hypercholesterolemia, unspecified: Secondary | ICD-10-CM | POA: Diagnosis not present

## 2021-08-18 DIAGNOSIS — R609 Edema, unspecified: Secondary | ICD-10-CM

## 2021-08-18 DIAGNOSIS — I10 Essential (primary) hypertension: Secondary | ICD-10-CM

## 2021-08-18 DIAGNOSIS — Z5181 Encounter for therapeutic drug level monitoring: Secondary | ICD-10-CM

## 2021-08-18 DIAGNOSIS — R600 Localized edema: Secondary | ICD-10-CM

## 2021-08-18 HISTORY — DX: Localized edema: R60.0
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
## 2021-08-18 NOTE — Patient Instructions (Signed)
Medication Instructions:  Your physician recommends that you continue on your current medications as directed. Please refer to the Current Medication list given to you today.  *If you need a refill on your cardiac medications before your next appointment, please call your pharmacy*  Lab Work: FASTING LP/CMET SOON   If you have labs (blood work) drawn today and your tests are completely normal, you will receive your results only by: [place] (if you have MyChart) OR A paper copy in the mail If you have any lab test that is abnormal or we need to change your treatment, we will call you to review the results.  Testing/Procedures: Your physician has requested that you have an echocardiogram. Echocardiography is a painless test that uses sound waves to create images of your heart. It provides your doctor with information about the size and shape of your heart and how well your heart's chambers and valves are working. This procedure takes approximately one hour. There are no restrictions for this procedure.   Follow-Up: At [facility], you and your health needs are our priority.  As part of our continuing mission to provide you with exceptional heart care, we have created designated Provider Care Teams.  These Care Teams include your primary Cardiologist (physician) and Advanced Practice Providers (APPs -  Physician Assistants and Nurse Practitioners) who all work together to provide you with the care you need, when you need it.  We recommend signing up for the patient portal called "MyChart".  Sign up information is provided on this After Visit Summary.  MyChart is used to connect with patients for Virtual Visits (Telemedicine).  Patients are able to view lab/test results, encounter notes, upcoming appointments, etc.  Non-urgent messages can be sent to your provider as well.   To learn more about what you can do with MyChart, go to [email or web address].    Your next appointment:   3  month(s)  The format for your next appointment:   In Person  Provider:   Skeet Latch, MD or Laurann Montana, NP

## 2021-09-08 ENCOUNTER — Ambulatory Visit (HOSPITAL_COMMUNITY): Payer: Medicare HMO | Attending: Internal Medicine

## 2021-09-08 ENCOUNTER — Other Ambulatory Visit: Payer: Self-pay

## 2021-09-08 DIAGNOSIS — I10 Essential (primary) hypertension: Secondary | ICD-10-CM | POA: Insufficient documentation

## 2021-09-08 DIAGNOSIS — Z5181 Encounter for therapeutic drug level monitoring: Secondary | ICD-10-CM | POA: Insufficient documentation

## 2021-09-08 DIAGNOSIS — R609 Edema, unspecified: Secondary | ICD-10-CM | POA: Diagnosis not present

## 2021-09-08 LAB — ECHOCARDIOGRAM COMPLETE
Area-P 1/2: 3.6 cm2
P 1/2 time: 832 msec
S' Lateral: 2.6 cm

## 2021-09-10 ENCOUNTER — Other Ambulatory Visit (HOSPITAL_BASED_OUTPATIENT_CLINIC_OR_DEPARTMENT_OTHER): Payer: Self-pay | Admitting: *Deleted

## 2021-09-10 DIAGNOSIS — I5189 Other ill-defined heart diseases: Secondary | ICD-10-CM

## 2021-09-10 DIAGNOSIS — I77819 Aortic ectasia, unspecified site: Secondary | ICD-10-CM

## 2021-09-21 ENCOUNTER — Ambulatory Visit (HOSPITAL_BASED_OUTPATIENT_CLINIC_OR_DEPARTMENT_OTHER): Payer: Medicare HMO | Admitting: Cardiovascular Disease

## 2021-09-21 DIAGNOSIS — Z6839 Body mass index (BMI) 39.0-39.9, adult: Secondary | ICD-10-CM | POA: Diagnosis not present

## 2021-09-21 DIAGNOSIS — Z8249 Family history of ischemic heart disease and other diseases of the circulatory system: Secondary | ICD-10-CM | POA: Diagnosis not present

## 2021-09-21 DIAGNOSIS — R32 Unspecified urinary incontinence: Secondary | ICD-10-CM | POA: Diagnosis not present

## 2021-09-21 DIAGNOSIS — I10 Essential (primary) hypertension: Secondary | ICD-10-CM | POA: Diagnosis not present

## 2021-09-21 DIAGNOSIS — I25119 Atherosclerotic heart disease of native coronary artery with unspecified angina pectoris: Secondary | ICD-10-CM | POA: Diagnosis not present

## 2021-09-21 DIAGNOSIS — Z7722 Contact with and (suspected) exposure to environmental tobacco smoke (acute) (chronic): Secondary | ICD-10-CM | POA: Diagnosis not present

## 2021-09-23 ENCOUNTER — Other Ambulatory Visit: Payer: Self-pay | Admitting: Cardiovascular Disease

## 2021-10-09 ENCOUNTER — Telehealth: Payer: Self-pay | Admitting: Cardiovascular Disease

## 2021-10-09 NOTE — Telephone Encounter (Signed)
Alasha is calling stating she just received a call from the office. Unable to find documentation as to what it is in regards to.

## 2021-10-09 NOTE — Telephone Encounter (Signed)
Left message for pt to call.

## 2021-10-14 ENCOUNTER — Other Ambulatory Visit: Payer: Self-pay

## 2021-10-14 ENCOUNTER — Ambulatory Visit (INDEPENDENT_AMBULATORY_CARE_PROVIDER_SITE_OTHER): Payer: Medicare HMO

## 2021-10-14 DIAGNOSIS — Z0000 Encounter for general adult medical examination without abnormal findings: Secondary | ICD-10-CM

## 2021-10-14 NOTE — Telephone Encounter (Signed)
Spoke with patient who states she does not know the reason for the call from our office. I advised I do not see a call from this week, however she was due for repeat lab work in August that was never completed. I gave her instructions for getting lab work done at our [place] location and she verbalized agreement to get that done as soon as possible. She thanked me for the call.

## 2021-10-14 NOTE — Progress Notes (Signed)
Appointment Outcome: Completed, Session #: 3 month follow up Start time: 9:00am   End time: 9:58am   Total Mins: 28 minutes  AGREEMENTS SECTION   Overall Goal(s): Improving healthy eating behaviors Increasing physical activity    Agreement/Action Steps:  Step 1: Read food labels to monitor sodium and sugar intake Step 2: Purchase low- or no sodium added products Step 3: Cook without adding salt Step 4: Bake/boil chicken Step 5: Walk in park for 45 mins (4 days/wk) Step 6: Perform chair exercises 30-45 mins/day Step 7: Perform leg lifts with weight machine and using stretch bands for toning arms (3  days/wk) Step 8: Check blood pressure as instructed Step 9: Take blood pressure medications as prescribed  Progress Notes:  Patient reported that she is reading food labels to track sodium and sugar intake. Patient stated that she has gained 11 lbs. and has talked with the doctor about things that she can do to lose weight. Patient stated that she has limited herself to one slice of wheat bread per week. Patient has reduced her consumption of candies as well. Patient mentioned that she does eat no salt chips that she buys from [employer]. Patient eats pasta once every two weeks but have been eating Sn[employer] and [employer].   Patient continues to purchase low- or no sodium added products. Patient stated that she does not cook with salt any time. Patient stated that she must fight her cravings for fried chicken. Patient shared an incident where she had to talk herself out of going to get fried chicken and instead, she cooked at home (steak, potatoes, and green beans). Patient continues to bake or boil all her meats.   Patient stated that she is going to [employer] now to walk on the treadmill for 45 minutes (Mon, Wed, and Fri - sometimes Sat) instead of going to the park to walk. Patient mentioned that while watching tv she still performs her chair exercises daily. Patient has not been able to  perform leg lifts at her son's house with his weight machine or use stretch bands for toning arms because he took it down.   Patient shared that her blood pressure has been in the normal range. Patient stated that she is checking her blood pressure as instructed and taking her medications as prescribed. Patient also stated that her cholesterol has improved. Patient reported that she has not had any chewing tobacco since March 2022. Patient shared that she is happy that she was able to quit and tell her children this often.   Indicators of Success and Accountability:  Patient stated that staying away from restaurants and fast food has been her indicator of success and accountability to watching what she eats.  Readiness: Patient is in the action stage of improving healthy eating behaviors and increasing physical activity.  Strengths and Supports: Patient is being supported by her family and friends. Patient is utilizing will power and discipline to keep up with action steps consistently.  Challenges and Barriers: Patient does not foresee any challenges to implementing her action steps over the next three months.   Coaching Outcomes: Patient remains motivated to improve her health because she wants to remain healthy to be able to engage with her great-grandson.   Patient will implement action steps over the next three months (6 months since coaching session ended) as outlined below.   Agreement/Action Steps:  Step 1: Read food labels to monitor carb, sodium, and sugar intake Step 2: Minimize the amount  of carbs and sweets that she is consuming by limiting her bread and  candy consumption.  Step 3: Purchase low- or no sodium added products Step 4: Cook without adding salt Step 5: Bake/boil meats Step 6: Walk on treadmill for 45 minutes (Mon, Wed, and Fri) Step 7: Perform chair exercises 30-45 mins/day Step 8: Check blood pressure as instructed Step 9: Take blood pressure medications as  prescribed  Attempted: Fulfilled - Patient continues to read food labels, purchase low- or no sodium added products, does not cook with salt, continues to bake/boil meats, perform chair exercises daily, checks blood pressure daily, and is taking blood pressure medications as prescribed.    Not attempted: Dropped/Revised - Patient had to stop using the weight machine at her son's house to do leg lifts because it was not available for her to use anymore. Patient is going to the gym to walk on the treadmill instead of walking in the park for 45 minutes.

## 2021-10-19 DIAGNOSIS — I10 Essential (primary) hypertension: Secondary | ICD-10-CM | POA: Diagnosis not present

## 2021-10-19 DIAGNOSIS — Z5181 Encounter for therapeutic drug level monitoring: Secondary | ICD-10-CM | POA: Diagnosis not present

## 2021-10-20 LAB — COMPREHENSIVE METABOLIC PANEL
ALT: 11 IU/L (ref 0–32)
AST: 14 IU/L (ref 0–40)
Albumin/Globulin Ratio: 1.4 (ref 1.2–2.2)
Albumin: 4.4 g/dL (ref 3.8–4.8)
Alkaline Phosphatase: 94 IU/L (ref 44–121)
BUN/Creatinine Ratio: 12 (ref 12–28)
BUN: 16 mg/dL (ref 8–27)
Bilirubin Total: 0.3 mg/dL (ref 0.0–1.2)
CO2: 23 mmol/L (ref 20–29)
Calcium: 9.3 mg/dL (ref 8.7–10.3)
Chloride: 107 mmol/L — ABNORMAL HIGH (ref 96–106)
Creatinine, Ser: 1.32 mg/dL — ABNORMAL HIGH (ref 0.57–1.00)
Globulin, Total: 3.2 g/dL (ref 1.5–4.5)
Glucose: 104 mg/dL — ABNORMAL HIGH (ref 70–99)
Potassium: 4.4 mmol/L (ref 3.5–5.2)
Sodium: 144 mmol/L (ref 134–144)
Total Protein: 7.6 g/dL (ref 6.0–8.5)
eGFR: 45 mL/min/{1.73_m2} — ABNORMAL LOW (ref >59)

## 2021-10-20 LAB — LIPID PANEL
Chol/HDL Ratio: 5.1 ratio — ABNORMAL HIGH (ref 0.0–4.4)
Cholesterol, Total: 200 mg/dL — ABNORMAL HIGH (ref 100–199)
HDL: 39 mg/dL — ABNORMAL LOW (ref >39)
LDL Chol Calc (NIH): 132 mg/dL — ABNORMAL HIGH (ref 0–99)
Triglycerides: 161 mg/dL — ABNORMAL HIGH (ref 0–149)
VLDL Cholesterol Cal: 29 mg/dL (ref 5–40)

## 2021-10-22 ENCOUNTER — Telehealth: Payer: Self-pay | Admitting: Cardiovascular Disease

## 2021-10-22 DIAGNOSIS — E785 Hyperlipidemia, unspecified: Secondary | ICD-10-CM

## 2021-10-22 DIAGNOSIS — Z0000 Encounter for general adult medical examination without abnormal findings: Secondary | ICD-10-CM

## 2021-10-22 MED ORDER — ROSUVASTATIN CALCIUM 20 MG PO TABS: 20.0000 mg | ORAL_TABLET | Freq: Every day | ORAL | 3 refills | Status: AC

## 2021-10-22 MED ORDER — CARVEDILOL 12.5 MG PO TABS: 12.5000 mg | ORAL_TABLET | Freq: Two times a day (BID) | ORAL | 3 refills | Status: AC

## 2021-10-22 NOTE — Telephone Encounter (Signed)
Patient returned call for lab results.  

## 2021-10-22 NOTE — Telephone Encounter (Signed)
Kristen Hansen, LPN  78/58/8502 77:41 AM EDT Back to Top    Left message to call back    Skeet Latch, MD  10/20/2021  8:14 AM EDT     Cholesterol levels are too high.  Her 10-year risk of a heart attack or stroke is 21%.  Therefore her LDL needs to be at least less than 100.  Recommend starting rosuvastatin 20 mg daily.  Repeat lipids and a CMP in 2 to 3 months.   Patient called w/results. Rx(s) sent to pharmacy electronically. Lab slips mailed

## 2021-11-02 ENCOUNTER — Ambulatory Visit: Payer: Medicare HMO | Admitting: Family Medicine

## 2021-11-09 DIAGNOSIS — E785 Hyperlipidemia, unspecified: Secondary | ICD-10-CM | POA: Diagnosis not present

## 2021-11-09 DIAGNOSIS — Z0000 Encounter for general adult medical examination without abnormal findings: Secondary | ICD-10-CM | POA: Diagnosis not present

## 2021-11-09 LAB — LIPID PANEL
Chol/HDL Ratio: 3.6 ratio (ref 0.0–4.4)
Cholesterol, Total: 133 mg/dL (ref 100–199)
HDL: 37 mg/dL — ABNORMAL LOW (ref >39)
LDL Chol Calc (NIH): 77 mg/dL (ref 0–99)
Triglycerides: 100 mg/dL (ref 0–149)
VLDL Cholesterol Cal: 19 mg/dL (ref 5–40)

## 2021-11-09 LAB — COMPREHENSIVE METABOLIC PANEL
ALT: 10 IU/L (ref 0–32)
AST: 12 IU/L (ref 0–40)
Albumin/Globulin Ratio: 1.3 (ref 1.2–2.2)
Albumin: 4.3 g/dL (ref 3.8–4.8)
Alkaline Phosphatase: 99 IU/L (ref 44–121)
BUN/Creatinine Ratio: 16 (ref 12–28)
BUN: 20 mg/dL (ref 8–27)
Bilirubin Total: 0.3 mg/dL (ref 0.0–1.2)
CO2: 23 mmol/L (ref 20–29)
Calcium: 9.6 mg/dL (ref 8.7–10.3)
Chloride: 105 mmol/L (ref 96–106)
Creatinine, Ser: 1.24 mg/dL — ABNORMAL HIGH (ref 0.57–1.00)
Globulin, Total: 3.3 g/dL (ref 1.5–4.5)
Glucose: 103 mg/dL — ABNORMAL HIGH (ref 70–99)
Potassium: 4.7 mmol/L (ref 3.5–5.2)
Sodium: 142 mmol/L (ref 134–144)
Total Protein: 7.6 g/dL (ref 6.0–8.5)
eGFR: 48 mL/min/{1.73_m2} — ABNORMAL LOW (ref >59)

## 2021-11-13 ENCOUNTER — Encounter: Payer: Self-pay | Admitting: Internal Medicine

## 2021-11-24 ENCOUNTER — Encounter (HOSPITAL_BASED_OUTPATIENT_CLINIC_OR_DEPARTMENT_OTHER): Payer: Self-pay | Admitting: Cardiovascular Disease

## 2021-11-24 ENCOUNTER — Ambulatory Visit (INDEPENDENT_AMBULATORY_CARE_PROVIDER_SITE_OTHER): Payer: Medicare HMO | Admitting: Cardiovascular Disease

## 2021-11-24 ENCOUNTER — Other Ambulatory Visit: Payer: Self-pay

## 2021-11-24 DIAGNOSIS — R600 Localized edema: Secondary | ICD-10-CM | POA: Diagnosis not present

## 2021-11-24 DIAGNOSIS — I10 Essential (primary) hypertension: Secondary | ICD-10-CM | POA: Diagnosis not present

## 2021-11-24 DIAGNOSIS — E7800 Pure hypercholesterolemia, unspecified: Secondary | ICD-10-CM | POA: Diagnosis not present

## 2021-11-24 NOTE — Assessment & Plan Note (Signed)
Lipids are very well have been controlled.  Continue rosuvastatin.

## 2021-11-24 NOTE — Assessment & Plan Note (Signed)
Blood pressure is very well-controlled.  She has not yet taken her medicine.  Continue amlodipine, lisinopril, carvedilol, and hydralazine.  Continue with regular exercise.

## 2021-11-24 NOTE — Assessment & Plan Note (Signed)
Due to venous insufficiency.  Continue using compression stockings when she is going to be on her feet.  No edema on exam today.

## 2021-11-24 NOTE — Patient Instructions (Signed)
Medication Instructions:  ?Your physician recommends that you continue on your current medications as directed. Please refer to the Current Medication list given to you today.  ? ?*If you need a refill on your cardiac medications before your next appointment, please call your pharmacy* ? ?Lab Work: ?NONE ? ?Testing/Procedures: ?NONE ? ?Follow-Up: ?At [facility], you and your health needs are our priority.  As part of our continuing mission to provide you with exceptional heart care, we have created designated Provider Care Teams.  These Care Teams include your primary Cardiologist (physician) and Advanced Practice Providers (APPs -  Physician Assistants and Nurse Practitioners) who all work together to provide you with the care you need, when you need it. ? ?We recommend signing up for the patient portal called "MyChart".  Sign up information is provided on this After Visit Summary.  MyChart is used to connect with patients for Virtual Visits (Telemedicine).  Patients are able to view lab/test results, encounter notes, upcoming appointments, etc.  Non-urgent messages can be sent to your provider as well.   ?To learn more about what you can do with MyChart, go to [email or web address].   ? ?Your next appointment:   ?12 month(s) ? ?The format for your next appointment:   ?In Person ? ?Provider:   ?Tiffany Cresco, MD  ? ? ? ? ? ? ?

## 2021-11-24 NOTE — Assessment & Plan Note (Signed)
She was congratulated on no longer chewing tobacco.  She worked hard on this with our care guide.
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
## 2021-11-26 DIAGNOSIS — R011 Cardiac murmur, unspecified: Secondary | ICD-10-CM | POA: Diagnosis not present

## 2021-11-26 DIAGNOSIS — M8580 Other specified disorders of bone density and structure, unspecified site: Secondary | ICD-10-CM | POA: Diagnosis not present

## 2021-11-26 DIAGNOSIS — Z23 Encounter for immunization: Secondary | ICD-10-CM | POA: Diagnosis not present

## 2021-11-26 DIAGNOSIS — I509 Heart failure, unspecified: Secondary | ICD-10-CM | POA: Diagnosis not present

## 2021-11-26 DIAGNOSIS — Z79899 Other long term (current) drug therapy: Secondary | ICD-10-CM | POA: Diagnosis not present

## 2021-11-26 DIAGNOSIS — Z0000 Encounter for general adult medical examination without abnormal findings: Secondary | ICD-10-CM | POA: Diagnosis not present

## 2021-11-26 DIAGNOSIS — R69 Illness, unspecified: Secondary | ICD-10-CM | POA: Diagnosis not present

## 2021-11-26 DIAGNOSIS — M47816 Spondylosis without myelopathy or radiculopathy, lumbar region: Secondary | ICD-10-CM | POA: Diagnosis not present

## 2021-11-26 DIAGNOSIS — I110 Hypertensive heart disease with heart failure: Secondary | ICD-10-CM | POA: Diagnosis not present

## 2021-12-17 DIAGNOSIS — R69 Illness, unspecified: Secondary | ICD-10-CM | POA: Diagnosis not present

## 2021-12-17 DIAGNOSIS — M47816 Spondylosis without myelopathy or radiculopathy, lumbar region: Secondary | ICD-10-CM | POA: Diagnosis not present

## 2021-12-17 DIAGNOSIS — Z79899 Other long term (current) drug therapy: Secondary | ICD-10-CM | POA: Diagnosis not present

## 2021-12-17 DIAGNOSIS — E785 Hyperlipidemia, unspecified: Secondary | ICD-10-CM | POA: Diagnosis not present

## 2021-12-17 DIAGNOSIS — E559 Vitamin D deficiency, unspecified: Secondary | ICD-10-CM | POA: Diagnosis not present

## 2021-12-17 DIAGNOSIS — Z0001 Encounter for general adult medical examination with abnormal findings: Secondary | ICD-10-CM | POA: Diagnosis not present

## 2021-12-17 DIAGNOSIS — I130 Hypertensive heart and chronic kidney disease with heart failure and stage 1 through stage 4 chronic kidney disease, or unspecified chronic kidney disease: Secondary | ICD-10-CM | POA: Diagnosis not present

## 2021-12-17 DIAGNOSIS — Z7189 Other specified counseling: Secondary | ICD-10-CM | POA: Diagnosis not present

## 2021-12-17 DIAGNOSIS — I110 Hypertensive heart disease with heart failure: Secondary | ICD-10-CM | POA: Diagnosis not present

## 2022-01-13 ENCOUNTER — Telehealth: Payer: Self-pay

## 2022-01-13 DIAGNOSIS — Z0000 Encounter for general adult medical examination without abnormal findings: Secondary | ICD-10-CM

## 2022-01-13 NOTE — Telephone Encounter (Signed)
Called patient to reschedule health coaching session. Patient requested if the session could be held over the phone. Patient has been rescheduled for 1/20 at 8:45am. Patient will be called at this time.    Yeraldi Fidler Truman Hayward, [facility] Guide, Health Coach [address], [address] [place] [address] Telephone: [phone] Email: [email or web address]

## 2022-01-13 NOTE — Telephone Encounter (Signed)
Called patient to reschedule health coaching session for 01/14/22 at 8:45am due to not being in the office until 10:00am on that day. Patient did not answer. Left message for patient to return call to reschedule.    Jaelyne Deeg Truman Hayward, [facility] Guide, Health Coach [address] [place] [address] Telephone: [phone] Email: [email or web address]

## 2022-01-14 ENCOUNTER — Ambulatory Visit: Payer: Medicare HMO

## 2022-01-15 ENCOUNTER — Ambulatory Visit (INDEPENDENT_AMBULATORY_CARE_PROVIDER_SITE_OTHER): Payer: Medicare HMO

## 2022-01-15 ENCOUNTER — Other Ambulatory Visit: Payer: Self-pay

## 2022-01-15 DIAGNOSIS — Z0000 Encounter for general adult medical examination without abnormal findings: Secondary | ICD-10-CM

## 2022-01-15 NOTE — Progress Notes (Signed)
Appointment Outcome: Completed, Session #: Final f/u Start time: 8:46am   End time: 9:12am    Total Mins: 26 minutes  AGREEMENTS SECTION   Overall Goal(s): Improving healthy eating behaviors Increasing physical activity                                            Agreement/Action Steps:  Step 1: Read food labels to monitor carb, sodium, and sugar intake Step 2: Minimize the amount of carbs and sweets that she is consuming by limiting her bread and  candy consumption.  Step 3: Purchase low- or no sodium added products Step 4: Cook without adding salt Step 5: Bake/boil meats Step 6: Walk on treadmill for 45 minutes (Mon, Wed, and Fri) Step 7: Perform chair exercises 30-45 mins/day Step 8: Check blood pressure as instructed Step 9: Take blood pressure medications as prescribed  Progress Notes:  Patient continues to read food labels to monitor sodium and sugar intake. Patient stated that she still avoids using salt when she cooks. Patient continues to purchase low- or no sodium products. Patient has ensured that she has not fried any foods over the past three months. Patient stated that she pan-seared her steak recently.    Patient stated that she is not waking because she has arthritis in her knees. Patient stated that since learning of this condition, she has been using a bike to exercise daily for 30 minutes. Patient stated that she has been working in her yard for physical activity as well. Patient performs chair exercises while watching her favorite tv shoes. Patient demonstrated how she do leg lifts during this time. Patient was using the weight machine and stretch back for toning her arms, but her son took down the equipment.   Patient stated that she checks her blood pressure every day, but do not write her readings down. Patient mentioned getting a notebook to write her blood pressure readings down. Patient is taking her blood pressure medication as prescribed. Patient shared that she  remains tobacco free. Patient expressed that overall, she feels good about her progress and ability to maintain steps.     Indicators of Success and Accountability: Patient stated that remaining tobacco free and continuing to ride her bike are her indicators of success and accountability.  Readiness: Patient is in the maintenance phase of improving healthy eating behaviors and increasing physical activity. Strengths and Supports: Patient is being supported by her family. Patient has been persistent with implementing her action steps. Challenges and Barriers: Patient does not foresee any challenges/barriers to implementing her action steps moving forward.    Coaching Outcomes: Patient will implement her action steps as outlined below moving forward.   Agreement/Action Steps:  Step 1: Read food labels to monitor carb, sodium, and sugar intake Step 2: Minimize the amount of carbs and sweets that she is consuming by limiting her bread and  candy consumption.  Step 3: Purchase low- or no sodium added products Step 4: Cook without adding salt Step 5: Bake/boil meats Step 6: Use portal bike daily for 30 minutes Step 7: Perform chair exercises 30-45 mins/day Step 8: Check blood pressure as instructed Step 9: Take blood pressure medications as prescribed  Attempted: Fulfilled - Patient continues to read food labels, purchase low- or no sodium products, cook without salt, bake/boil chicken, perform chair exercises and leg lifts, check blood pressure and take  blood pressure medications as prescribed.  ° °Not met - Patient has not walked due to condition but used portal bike.  ° ° ° °

## 2022-02-04 ENCOUNTER — Other Ambulatory Visit: Payer: Self-pay | Admitting: Cardiovascular Disease

## 2022-04-13 ENCOUNTER — Other Ambulatory Visit: Payer: Self-pay | Admitting: Family Medicine

## 2022-04-13 ENCOUNTER — Other Ambulatory Visit: Payer: Self-pay | Admitting: Cardiovascular Disease

## 2022-04-13 ENCOUNTER — Telehealth: Payer: Self-pay

## 2022-04-13 DIAGNOSIS — I10 Essential (primary) hypertension: Secondary | ICD-10-CM

## 2022-04-13 NOTE — Telephone Encounter (Signed)
Rx(s) sent to pharmacy electronically.  

## 2022-04-13 NOTE — Telephone Encounter (Signed)
Recv'd refill request, called pt and she has changed to [facility] physicians,  advised to have pharmacy send refill request to them

## 2022-06-07 ENCOUNTER — Other Ambulatory Visit: Payer: Self-pay | Admitting: Registered Nurse

## 2022-06-07 ENCOUNTER — Ambulatory Visit
Admission: RE | Admit: 2022-06-07 | Discharge: 2022-06-07 | Disposition: A | Discharge status: Home / Self Care | Payer: Medicare HMO | Source: Ambulatory Visit | Attending: Registered Nurse | Admitting: Registered Nurse

## 2022-06-07 DIAGNOSIS — M7122 Synovial cyst of popliteal space [Baker], left knee: Secondary | ICD-10-CM

## 2022-09-06 ENCOUNTER — Other Ambulatory Visit (HOSPITAL_BASED_OUTPATIENT_CLINIC_OR_DEPARTMENT_OTHER): Payer: Self-pay | Admitting: *Deleted

## 2022-09-06 DIAGNOSIS — I5189 Other ill-defined heart diseases: Secondary | ICD-10-CM

## 2022-09-06 DIAGNOSIS — I77819 Aortic ectasia, unspecified site: Secondary | ICD-10-CM

## 2022-09-06 DIAGNOSIS — I10 Essential (primary) hypertension: Secondary | ICD-10-CM

## 2022-09-20 ENCOUNTER — Ambulatory Visit (INDEPENDENT_AMBULATORY_CARE_PROVIDER_SITE_OTHER): Payer: Medicare HMO

## 2022-09-20 DIAGNOSIS — I5189 Other ill-defined heart diseases: Secondary | ICD-10-CM | POA: Diagnosis not present

## 2022-09-20 DIAGNOSIS — I10 Essential (primary) hypertension: Secondary | ICD-10-CM

## 2022-09-20 DIAGNOSIS — I77819 Aortic ectasia, unspecified site: Secondary | ICD-10-CM

## 2022-09-20 LAB — ECHOCARDIOGRAM COMPLETE
AR max vel: 2.53 cm2
AV Area VTI: 2.6 cm2
AV Area mean vel: 2.68 cm2
AV Mean grad: 4 mmHg
AV Peak grad: 7.2 mmHg
AV Vena cont: 0.41 cm
Ao pk vel: 1.34 m/s
Area-P 1/2: 2.62 cm2
Calc EF: 68.2 %
P 1/2 time: 1166 msec
S' Lateral: 2.59 cm
Single Plane A2C EF: 64.1 %
Single Plane A4C EF: 70.9 %

## 2023-08-12 ENCOUNTER — Other Ambulatory Visit: Payer: Self-pay | Admitting: Family Medicine

## 2023-08-12 DIAGNOSIS — I70203 Unspecified atherosclerosis of native arteries of extremities, bilateral legs: Secondary | ICD-10-CM

## 2023-08-15 ENCOUNTER — Encounter: Payer: Self-pay | Admitting: Family Medicine

## 2023-08-22 ENCOUNTER — Ambulatory Visit
Admission: RE | Admit: 2023-08-22 | Discharge: 2023-08-22 | Disposition: A | Discharge status: Home / Self Care | Payer: Medicare HMO | Source: Ambulatory Visit | Attending: Family Medicine | Admitting: Family Medicine

## 2023-08-22 DIAGNOSIS — I70203 Unspecified atherosclerosis of native arteries of extremities, bilateral legs: Secondary | ICD-10-CM

## 2023-12-24 ENCOUNTER — Other Ambulatory Visit: Payer: Self-pay | Admitting: Cardiovascular Disease

## 2023-12-24 DIAGNOSIS — I10 Essential (primary) hypertension: Secondary | ICD-10-CM

## 2024-02-08 IMAGING — CR DG KNEE COMPLETE 4+V*R*
4 series · 4 of 4 positions shown · non-contrast
Comparison: None Available.

CLINICAL DATA: Pain

EXAM:
RIGHT KNEE - COMPLETE 4+ VIEW

[w knee ap right (1 of 2)]
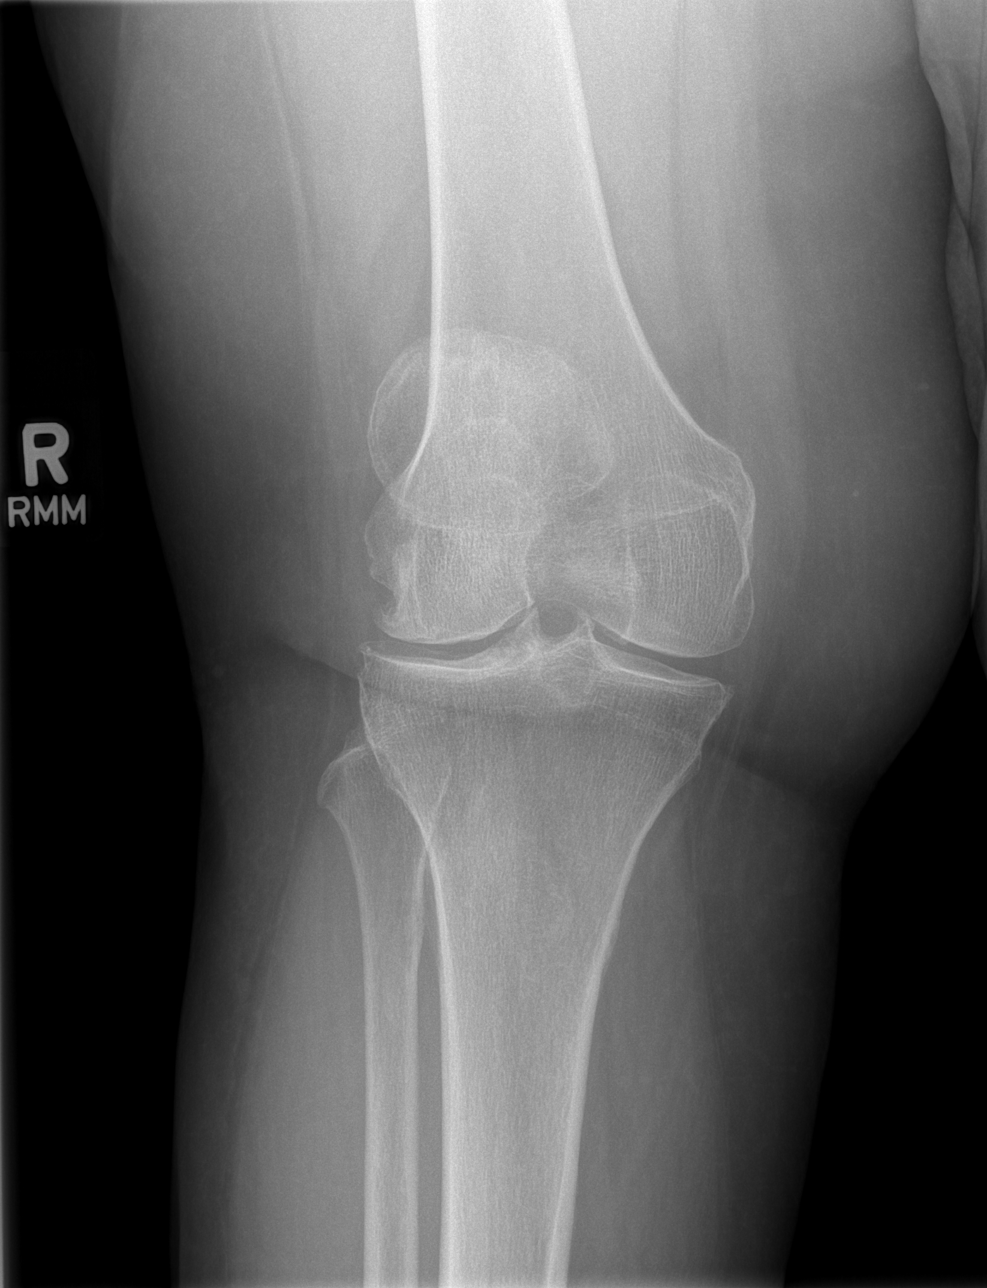

[w knee lat. right]
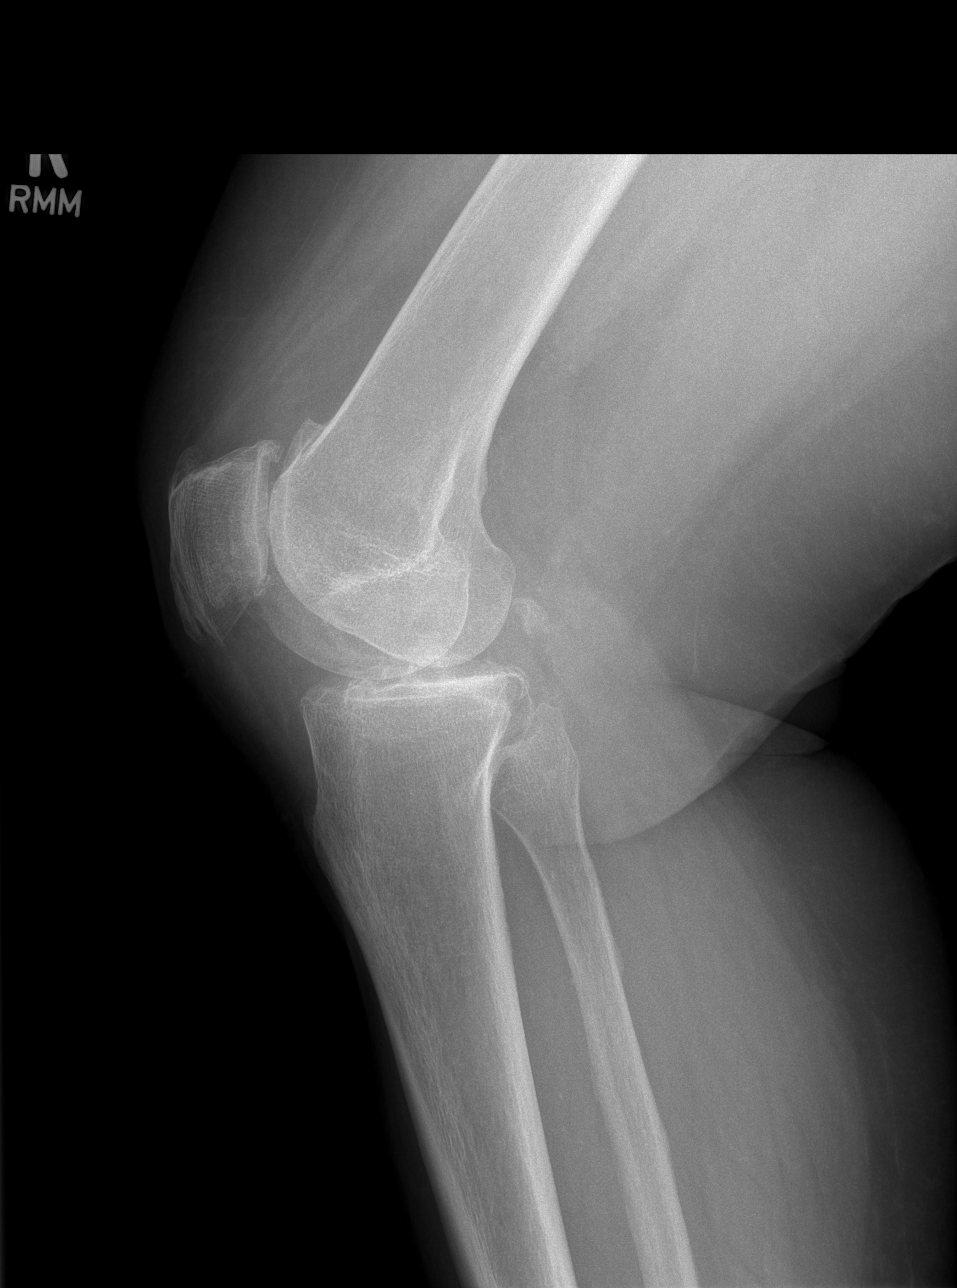

[w knee ap right (2 of 2)]
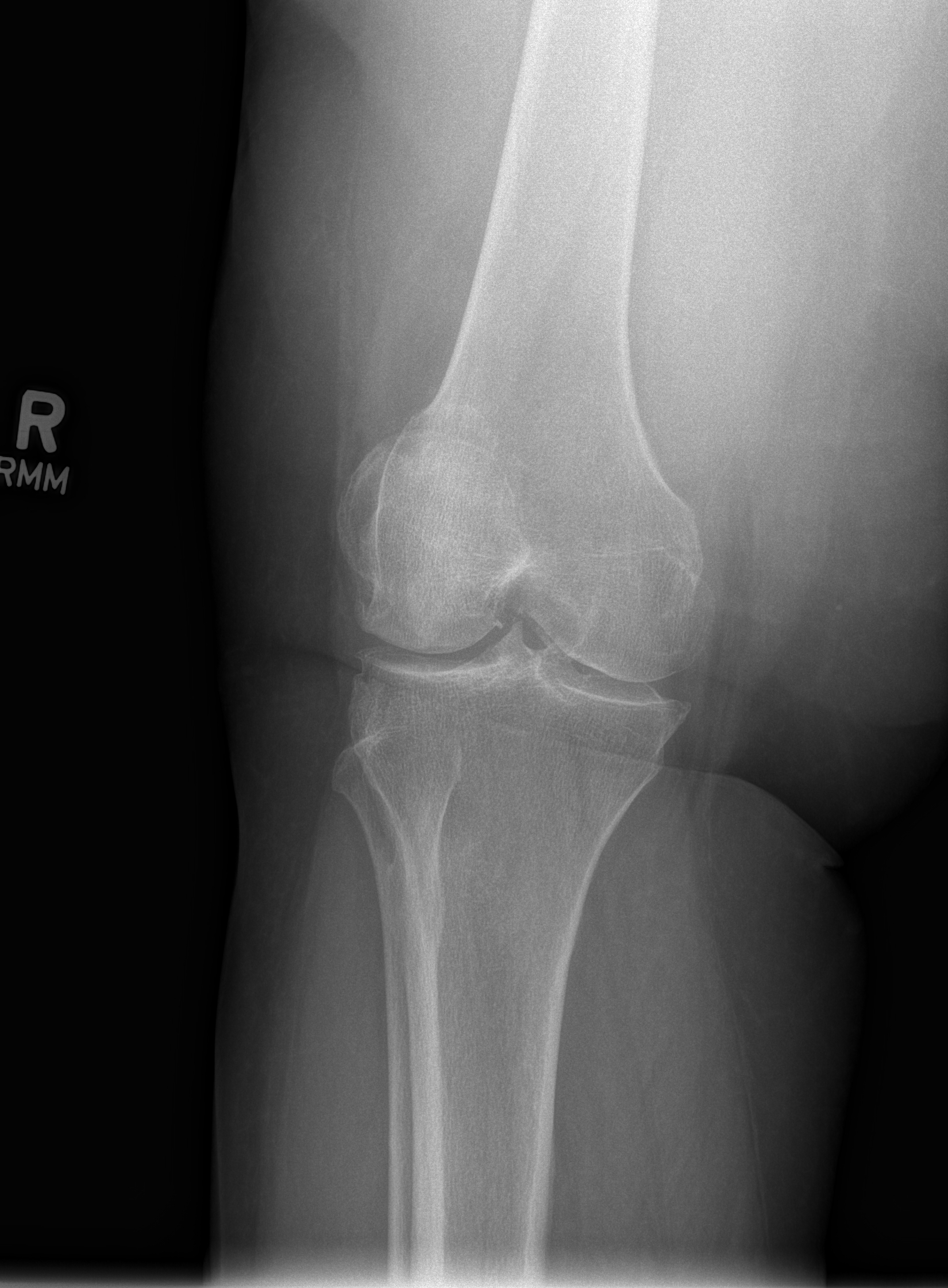

[w knee ap right *]
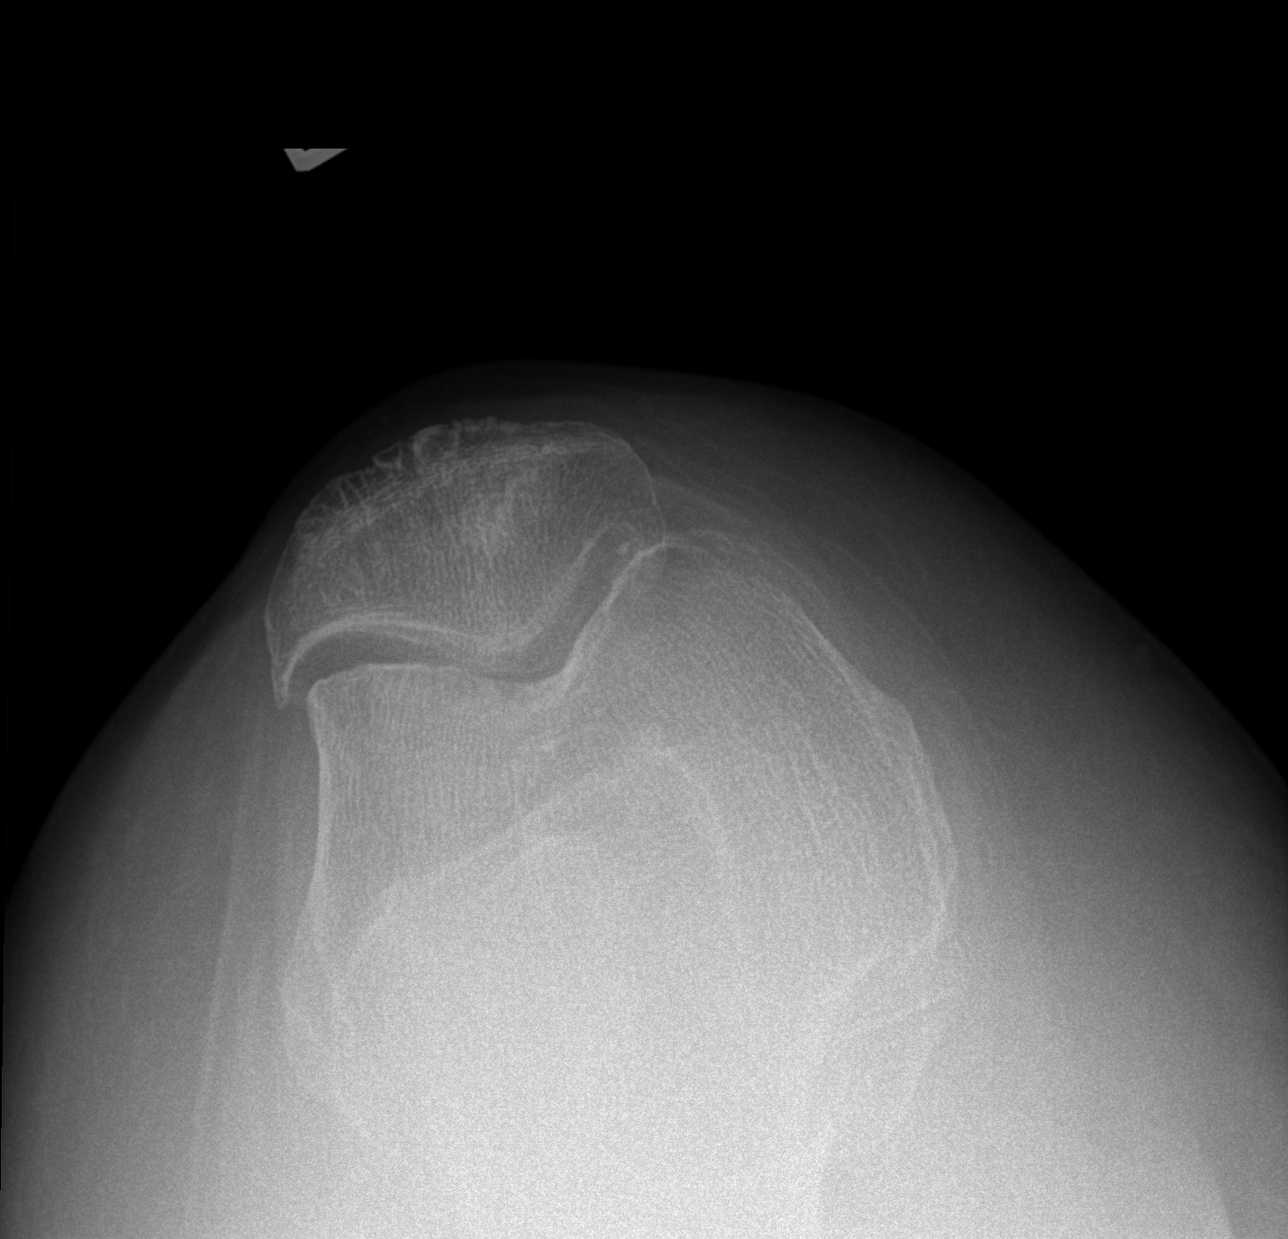

[4 of 4 positions shown; findings below may reference images not displayed]

FINDINGS: Enthesopathic changes associated with the superior and inferior
anterior patella. Tricompartmental degenerative changes. No
fractures or dislocations. No joint effusion. No other acute
abnormalities.
IMPRESSION: Tricompartmental degenerative changes and enthesopathic changes. No
other acute abnormalities.

## 2024-03-29 ENCOUNTER — Encounter: Payer: Self-pay | Admitting: Orthopaedic Surgery

## 2024-03-29 ENCOUNTER — Ambulatory Visit (INDEPENDENT_AMBULATORY_CARE_PROVIDER_SITE_OTHER): Admitting: Orthopaedic Surgery

## 2024-03-29 ENCOUNTER — Other Ambulatory Visit (INDEPENDENT_AMBULATORY_CARE_PROVIDER_SITE_OTHER): Payer: Self-pay

## 2024-03-29 DIAGNOSIS — M25561 Pain in right knee: Secondary | ICD-10-CM

## 2024-03-29 DIAGNOSIS — G8929 Other chronic pain: Secondary | ICD-10-CM

## 2024-03-29 DIAGNOSIS — M1711 Unilateral primary osteoarthritis, right knee: Secondary | ICD-10-CM | POA: Diagnosis not present

## 2024-03-29 DIAGNOSIS — M170 Bilateral primary osteoarthritis of knee: Secondary | ICD-10-CM | POA: Diagnosis not present

## 2024-03-29 DIAGNOSIS — M1712 Unilateral primary osteoarthritis, left knee: Secondary | ICD-10-CM | POA: Diagnosis not present

## 2024-03-29 DIAGNOSIS — M25562 Pain in left knee: Secondary | ICD-10-CM

## 2024-03-29 NOTE — Progress Notes (Signed)
 Office Visit Note   Patient: Kristen Shelton           Date of Birth: 1956/04/14           MRN: [id number] Visit Date: 03/29/2024              Requested by: Ellyn Hack, MD [address] [place],  [place] [address] PCP: Loura Back, NP   Assessment & Plan: Visit Diagnoses:  1. Primary osteoarthritis of left knee   2. Primary osteoarthritis of right knee   3. Chronic pain of both knees     Plan: Kahli is a very pleasant 68 year old female with end-stage left greater than right knee DJD.  Based on her options she has elected to move forward with a left total knee arthroplasty.  Risk benefits prognosis reviewed.  Once we have the necessary clearances from PCP Eunice Blase will call her to confirm surgery date.  Impression is severe left knee degenerative joint disease secondary to Osteoarthritis.  Bone on bone joint space narrowing is seen on radiographs with neutral alignment.  At this point, conservative treatments fail to provide any significant relief and the pain is severely affecting ADLs and quality of life.  Based on treatment options, the patient has elected to move forward with a knee replacement.  We have discussed the surgical risks that include but are not limited to infection, DVT, leg length discrepancy, stiffness, numbness, tingling, incomplete relief of pain.  Recovery and prognosis were also reviewed.    Anticoagulants: No antithrombotic Postop anticoagulation: Eliquis Diabetic: No  Nickel allergy: No Prior DVT/PE: No Tobacco use: No Clearances needed for surgery: PCP Anticipated discharge dispo: Home lives with son  In regards to the right knee she will continue with conservative management.  Follow-Up Instructions: No follow-ups on file.   Orders:  Orders Placed This Encounter  Procedures   XR KNEE 3 VIEW LEFT   XR KNEE 3 VIEW RIGHT   No orders of the defined types were placed in this encounter.     Procedures: No procedures performed   Clinical  Data: No additional findings.   Subjective: Chief Complaint  Patient presents with   Right Knee - Pain   Left Knee - Pain    HPI Kristen Shelton is a 68 year old female who comes in for bilateral knee pain greater on the left.  She has had pain for many years.  She has had multiple cortisone injections in the past which were originally effective for short periods of time.  She reports giving way swelling and crepitus.  Her symptoms have grown more severe and are now affecting all aspects of ADLs such as walking and performing hygiene.  Also has trouble with gardening which she really enjoys doing. Review of Systems   Objective: Vital Signs: LMP 07/13/2020   Physical Exam  Ortho Exam Examination of bilateral knees show range of motion of 0 to 100 degrees with pain.  No joint effusion.  Collaterals and cruciates are stable.  Bilateral joint line tenderness.  Collaterals and cruciates are stable. Specialty Comments:  No specialty comments available.  Imaging: XR KNEE 3 VIEW RIGHT Result Date: 03/29/2024 X-rays of the right knee show moderately severe osteoarthritis with neutral alignment.  XR KNEE 3 VIEW LEFT Result Date: 03/29/2024 X-rays of the left knee show near bone-on-bone joint space narrowing of the femoral-tibial compartments.  Neutral alignment.    PMFS History: Patient Active Problem List   Diagnosis Date Noted   Lower extremity edema 08/18/2021  Osteopenia determined by x-ray 02/15/2021   Class 2 severe obesity due to excess calories with serious comorbidity and body mass index (BMI) of 35.0 to 35.9 in adult (HCC) 11/25/2020   Pure hypercholesterolemia 11/25/2020   Proteinuria 10/29/2020   Prolonged menstruation 10/29/2020   Fibroids 10/29/2020   Chronic midline low back pain without sciatica 10/29/2020   Lumbar facet arthropathy 10/29/2020   Estrogen deficiency 10/29/2020   Unexplained weight gain 09/17/2020   Acute pain of right shoulder 09/17/2020   Obesity (BMI  30-39.9) 09/17/2020   KNEE PAIN, RIGHT 08/29/2009   Benign neoplasm of skin 05/15/2009   TRICHOMONIASIS 11/05/2008   Constipation 11/05/2008   Tobacco abuse 10/04/2008   HYPERTENSION, BENIGN ESSENTIAL 10/04/2008   Disorder of teeth and supporting structures 10/04/2008   Past Medical History:  Diagnosis Date   Hypertension    Lower extremity edema 08/18/2021   Osteopenia determined by x-ray 02/15/2021   Proteinuria 10/29/2020    Family History  Problem Relation Age of Onset   Heart attack Mother    Stroke Sister    Hypertension Sister    Bone cancer Brother    Colon cancer Neg Hx    Colon polyps Neg Hx    Esophageal cancer Neg Hx    Rectal cancer Neg Hx    Stomach cancer Neg Hx     Past Surgical History:  Procedure Laterality Date   TUBAL LIGATION     Social History   Occupational History   Not on file  Tobacco Use   Smoking status: Never   Smokeless tobacco: Former    Types: Chew    Quit date: 03/17/2021   Tobacco comments:    chewing tobacco   Vaping Use   Vaping status: Never Used  Substance and Sexual Activity   Alcohol use: No   Drug use: No   Sexual activity: Yes    Birth control/protection: Post-menopausal    Comment: 1st intercourse 68 yo-More than 5 partners

## 2024-04-09 ENCOUNTER — Other Ambulatory Visit: Payer: Self-pay | Admitting: Cardiovascular Disease

## 2024-04-09 DIAGNOSIS — I10 Essential (primary) hypertension: Secondary | ICD-10-CM
